# Patient Record
Sex: Male | Born: 1955 | Race: White | Hispanic: No | Marital: Single | Smoking: Never smoker
Health system: Southern US, Community
[De-identification: ages and names within clinical notes are randomized; demographics above are authoritative.]

## PROBLEM LIST (undated history)

## (undated) DIAGNOSIS — L409 Psoriasis, unspecified: Secondary | ICD-10-CM

## (undated) DIAGNOSIS — K219 Gastro-esophageal reflux disease without esophagitis: Secondary | ICD-10-CM

## (undated) DIAGNOSIS — G47419 Narcolepsy without cataplexy: Secondary | ICD-10-CM

## (undated) DIAGNOSIS — G43909 Migraine, unspecified, not intractable, without status migrainosus: Secondary | ICD-10-CM

## (undated) DIAGNOSIS — L4050 Arthropathic psoriasis, unspecified: Secondary | ICD-10-CM

## (undated) DIAGNOSIS — G4730 Sleep apnea, unspecified: Secondary | ICD-10-CM

## (undated) DIAGNOSIS — I10 Essential (primary) hypertension: Secondary | ICD-10-CM

## (undated) HISTORY — DX: Essential (primary) hypertension: I10

## (undated) HISTORY — DX: Migraine, unspecified, not intractable, without status migrainosus: G43.909

## (undated) HISTORY — DX: Narcolepsy without cataplexy: G47.419

## (undated) HISTORY — PX: COLONOSCOPY: SHX174

## (undated) HISTORY — DX: Sleep apnea, unspecified: G47.30

## (undated) HISTORY — DX: Psoriasis, unspecified: L40.9

## (undated) HISTORY — DX: Arthropathic psoriasis, unspecified: L40.50

## (undated) HISTORY — DX: Gastro-esophageal reflux disease without esophagitis: K21.9

---

## 2003-07-13 ENCOUNTER — Ambulatory Visit (HOSPITAL_COMMUNITY): Admission: RE | Admit: 2003-07-13 | Discharge: 2003-07-13 | Payer: Self-pay | Admitting: Gastroenterology

## 2013-07-24 ENCOUNTER — Other Ambulatory Visit (HOSPITAL_BASED_OUTPATIENT_CLINIC_OR_DEPARTMENT_OTHER): Payer: Self-pay | Admitting: Family Medicine

## 2013-07-24 DIAGNOSIS — M545 Low back pain, unspecified: Secondary | ICD-10-CM

## 2013-07-26 ENCOUNTER — Ambulatory Visit (HOSPITAL_BASED_OUTPATIENT_CLINIC_OR_DEPARTMENT_OTHER)
Admission: RE | Admit: 2013-07-26 | Discharge: 2013-07-26 | Disposition: A | Discharge status: Home / Self Care | Payer: 59 | Source: Ambulatory Visit | Attending: Family Medicine | Admitting: Family Medicine

## 2013-07-26 DIAGNOSIS — M5126 Other intervertebral disc displacement, lumbar region: Secondary | ICD-10-CM | POA: Insufficient documentation

## 2013-07-26 DIAGNOSIS — M545 Low back pain, unspecified: Secondary | ICD-10-CM

## 2013-09-04 HISTORY — PX: LUMBAR SPINE SURGERY: SHX701

## 2020-05-10 ENCOUNTER — Inpatient Hospital Stay (HOSPITAL_COMMUNITY)
Admission: EM | Admit: 2020-05-10 | Discharge: 2020-05-22 | DRG: 958 | Disposition: A | Discharge status: Rehabilitation Facility | Payer: 59 | Attending: Surgery | Admitting: Surgery

## 2020-05-10 ENCOUNTER — Emergency Department (HOSPITAL_COMMUNITY): Payer: 59

## 2020-05-10 DIAGNOSIS — E8889 Other specified metabolic disorders: Secondary | ICD-10-CM | POA: Diagnosis present

## 2020-05-10 DIAGNOSIS — I10 Essential (primary) hypertension: Secondary | ICD-10-CM | POA: Diagnosis present

## 2020-05-10 DIAGNOSIS — S2249XA Multiple fractures of ribs, unspecified side, initial encounter for closed fracture: Secondary | ICD-10-CM | POA: Diagnosis present

## 2020-05-10 DIAGNOSIS — S2241XA Multiple fractures of ribs, right side, initial encounter for closed fracture: Principal | ICD-10-CM | POA: Diagnosis present

## 2020-05-10 DIAGNOSIS — Z419 Encounter for procedure for purposes other than remedying health state, unspecified: Secondary | ICD-10-CM

## 2020-05-10 DIAGNOSIS — E559 Vitamin D deficiency, unspecified: Secondary | ICD-10-CM | POA: Diagnosis present

## 2020-05-10 DIAGNOSIS — R0781 Pleurodynia: Secondary | ICD-10-CM

## 2020-05-10 DIAGNOSIS — L4050 Arthropathic psoriasis, unspecified: Secondary | ICD-10-CM | POA: Diagnosis present

## 2020-05-10 DIAGNOSIS — S2239XA Fracture of one rib, unspecified side, initial encounter for closed fracture: Secondary | ICD-10-CM | POA: Diagnosis not present

## 2020-05-10 DIAGNOSIS — Y9355 Activity, bike riding: Secondary | ICD-10-CM

## 2020-05-10 DIAGNOSIS — I951 Orthostatic hypotension: Secondary | ICD-10-CM | POA: Diagnosis not present

## 2020-05-10 DIAGNOSIS — Z20822 Contact with and (suspected) exposure to covid-19: Secondary | ICD-10-CM | POA: Diagnosis present

## 2020-05-10 DIAGNOSIS — S32599A Other specified fracture of unspecified pubis, initial encounter for closed fracture: Secondary | ICD-10-CM

## 2020-05-10 DIAGNOSIS — S42001A Fracture of unspecified part of right clavicle, initial encounter for closed fracture: Secondary | ICD-10-CM | POA: Diagnosis present

## 2020-05-10 DIAGNOSIS — S32431A Displaced fracture of anterior column [iliopubic] of right acetabulum, initial encounter for closed fracture: Secondary | ICD-10-CM | POA: Diagnosis present

## 2020-05-10 DIAGNOSIS — S270XXA Traumatic pneumothorax, initial encounter: Secondary | ICD-10-CM | POA: Diagnosis present

## 2020-05-10 DIAGNOSIS — Z23 Encounter for immunization: Secondary | ICD-10-CM

## 2020-05-10 DIAGNOSIS — S32592A Other specified fracture of left pubis, initial encounter for closed fracture: Secondary | ICD-10-CM | POA: Diagnosis present

## 2020-05-10 DIAGNOSIS — R339 Retention of urine, unspecified: Secondary | ICD-10-CM | POA: Diagnosis present

## 2020-05-10 DIAGNOSIS — T148XXA Other injury of unspecified body region, initial encounter: Secondary | ICD-10-CM

## 2020-05-10 DIAGNOSIS — S32451A Displaced transverse fracture of right acetabulum, initial encounter for closed fracture: Secondary | ICD-10-CM | POA: Diagnosis present

## 2020-05-10 DIAGNOSIS — S32301A Unspecified fracture of right ilium, initial encounter for closed fracture: Secondary | ICD-10-CM | POA: Diagnosis present

## 2020-05-10 DIAGNOSIS — D62 Acute posthemorrhagic anemia: Secondary | ICD-10-CM | POA: Diagnosis not present

## 2020-05-10 DIAGNOSIS — S42021A Displaced fracture of shaft of right clavicle, initial encounter for closed fracture: Secondary | ICD-10-CM | POA: Diagnosis present

## 2020-05-10 DIAGNOSIS — S32401A Unspecified fracture of right acetabulum, initial encounter for closed fracture: Secondary | ICD-10-CM

## 2020-05-10 LAB — SAMPLE TO BLOOD BANK

## 2020-05-10 LAB — COMPREHENSIVE METABOLIC PANEL
ALT: 31 U/L (ref 0–44)
AST: 36 U/L (ref 15–41)
Albumin: 3.6 g/dL (ref 3.5–5.0)
Alkaline Phosphatase: 78 U/L (ref 38–126)
Anion gap: 8 (ref 5–15)
BUN: 20 mg/dL (ref 8–23)
CO2: 22 mmol/L (ref 22–32)
Calcium: 8.3 mg/dL — ABNORMAL LOW (ref 8.9–10.3)
Chloride: 109 mmol/L (ref 98–111)
Creatinine, Ser: 0.98 mg/dL (ref 0.61–1.24)
GFR calc Af Amer: >60 mL/min (ref >60)
GFR calc non Af Amer: >60 mL/min (ref >60)
Glucose, Bld: 119 mg/dL — ABNORMAL HIGH (ref 70–99)
Potassium: 3.9 mmol/L (ref 3.5–5.1)
Sodium: 139 mmol/L (ref 135–145)
Total Bilirubin: 0.5 mg/dL (ref 0.3–1.2)
Total Protein: 5.5 g/dL — ABNORMAL LOW (ref 6.5–8.1)

## 2020-05-10 LAB — I-STAT CHEM 8, ED
BUN: 23 mg/dL (ref 8–23)
Calcium, Ion: 1.04 mmol/L — ABNORMAL LOW (ref 1.15–1.40)
Chloride: 107 mmol/L (ref 98–111)
Creatinine, Ser: 1 mg/dL (ref 0.61–1.24)
Glucose, Bld: 114 mg/dL — ABNORMAL HIGH (ref 70–99)
HCT: 39 % (ref 39.0–52.0)
Hemoglobin: 13.3 g/dL (ref 13.0–17.0)
Potassium: 3.8 mmol/L (ref 3.5–5.1)
Sodium: 141 mmol/L (ref 135–145)
TCO2: 22 mmol/L (ref 22–32)

## 2020-05-10 LAB — CBC
HCT: 41.4 % (ref 39.0–52.0)
Hemoglobin: 13.1 g/dL (ref 13.0–17.0)
MCH: 28.6 pg (ref 26.0–34.0)
MCHC: 31.6 g/dL (ref 30.0–36.0)
MCV: 90.4 fL (ref 80.0–100.0)
Platelets: 255 10*3/uL (ref 150–400)
RBC: 4.58 MIL/uL (ref 4.22–5.81)
RDW: 13.4 % (ref 11.5–15.5)
WBC: 17.1 10*3/uL — ABNORMAL HIGH (ref 4.0–10.5)
nRBC: 0 % (ref 0.0–0.2)

## 2020-05-10 LAB — ETHANOL: Alcohol, Ethyl (B): <10 mg/dL (ref <10)

## 2020-05-10 LAB — GLUCOSE, CAPILLARY: Glucose-Capillary: 134 mg/dL — ABNORMAL HIGH (ref 70–99)

## 2020-05-10 LAB — LACTIC ACID, PLASMA: Lactic Acid, Venous: 1.7 mmol/L (ref 0.5–1.9)

## 2020-05-10 LAB — SARS CORONAVIRUS 2 BY RT PCR (HOSPITAL ORDER, PERFORMED IN ~~LOC~~ HOSPITAL LAB): SARS Coronavirus 2: NEGATIVE

## 2020-05-10 LAB — PROTIME-INR
INR: 1.1 (ref 0.8–1.2)
Prothrombin Time: 13.9 seconds (ref 11.4–15.2)

## 2020-05-10 MED ORDER — PANTOPRAZOLE SODIUM 40 MG IV SOLR
40.0000 mg | Freq: Every day | INTRAVENOUS | Status: DC
  Administered 2020-05-10: 40 mg via INTRAVENOUS
  Filled 2020-05-10 (×3): qty 40

## 2020-05-10 MED ORDER — HYDRALAZINE HCL 20 MG/ML IJ SOLN: 10.0000 mg | INTRAMUSCULAR | Status: DC | PRN

## 2020-05-10 MED ORDER — ACETAMINOPHEN 325 MG PO TABS
650.0000 mg | ORAL_TABLET | Freq: Four times a day (QID) | ORAL | Status: DC
  Administered 2020-05-10 – 2020-05-12 (×6): 650 mg via ORAL
  Filled 2020-05-10 (×6): qty 2

## 2020-05-10 MED ORDER — PANTOPRAZOLE SODIUM 40 MG PO TBEC
40.0000 mg | DELAYED_RELEASE_TABLET | Freq: Every day | ORAL | Status: DC
  Administered 2020-05-11 – 2020-05-22 (×11): 40 mg via ORAL
  Filled 2020-05-10 (×11): qty 1

## 2020-05-10 MED ORDER — TETANUS-DIPHTH-ACELL PERTUSSIS 5-2.5-18.5 LF-MCG/0.5 IM SUSP
0.5000 mL | Freq: Once | INTRAMUSCULAR | Status: AC
  Administered 2020-05-10: 0.5 mL via INTRAMUSCULAR
  Filled 2020-05-10: qty 0.5

## 2020-05-10 MED ORDER — IBUPROFEN 400 MG PO TABS
800.0000 mg | ORAL_TABLET | Freq: Three times a day (TID) | ORAL | Status: DC
  Administered 2020-05-10 – 2020-05-13 (×8): 800 mg via ORAL
  Filled 2020-05-10 (×9): qty 2

## 2020-05-10 MED ORDER — OXYCODONE HCL 5 MG PO TABS
5.0000 mg | ORAL_TABLET | ORAL | Status: DC | PRN
  Administered 2020-05-10 – 2020-05-11 (×2): 5 mg via ORAL
  Filled 2020-05-10 (×2): qty 1

## 2020-05-10 MED ORDER — HYDROMORPHONE HCL 1 MG/ML IJ SOLN
1.0000 mg | Freq: Once | INTRAMUSCULAR | Status: AC
  Administered 2020-05-10: 1 mg via INTRAVENOUS
  Filled 2020-05-10: qty 1

## 2020-05-10 MED ORDER — IOHEXOL 300 MG/ML  SOLN
100.0000 mL | Freq: Once | INTRAMUSCULAR | Status: AC | PRN
  Administered 2020-05-10: 100 mL via INTRAVENOUS

## 2020-05-10 MED ORDER — ONDANSETRON HCL 4 MG/2ML IJ SOLN
4.0000 mg | Freq: Once | INTRAMUSCULAR | Status: AC
  Administered 2020-05-10: 4 mg via INTRAVENOUS
  Filled 2020-05-10: qty 2

## 2020-05-10 MED ORDER — SODIUM CHLORIDE 0.9 % IV SOLN: INTRAVENOUS | Status: DC

## 2020-05-10 MED ORDER — ONDANSETRON 4 MG PO TBDP: 4.0000 mg | ORAL_TABLET | Freq: Four times a day (QID) | ORAL | Status: DC | PRN

## 2020-05-10 MED ORDER — ONDANSETRON HCL 4 MG/2ML IJ SOLN: 4.0000 mg | Freq: Four times a day (QID) | INTRAMUSCULAR | Status: DC | PRN

## 2020-05-10 MED ORDER — GABAPENTIN 300 MG PO CAPS
300.0000 mg | ORAL_CAPSULE | Freq: Three times a day (TID) | ORAL | Status: DC
  Administered 2020-05-10 – 2020-05-22 (×35): 300 mg via ORAL
  Filled 2020-05-10 (×35): qty 1

## 2020-05-10 MED ORDER — METHOCARBAMOL 500 MG PO TABS: 500.0000 mg | ORAL_TABLET | Freq: Four times a day (QID) | ORAL | Status: DC | PRN

## 2020-05-10 MED ORDER — METOPROLOL TARTRATE 5 MG/5ML IV SOLN
5.0000 mg | Freq: Four times a day (QID) | INTRAVENOUS | Status: DC | PRN
  Filled 2020-05-10: qty 5

## 2020-05-10 MED ORDER — DOCUSATE SODIUM 100 MG PO CAPS
100.0000 mg | ORAL_CAPSULE | Freq: Two times a day (BID) | ORAL | Status: DC
  Administered 2020-05-10 – 2020-05-22 (×21): 100 mg via ORAL
  Filled 2020-05-10 (×23): qty 1

## 2020-05-10 MED ORDER — HYDROMORPHONE HCL 1 MG/ML IJ SOLN
0.5000 mg | INTRAMUSCULAR | Status: DC | PRN
  Administered 2020-05-10 – 2020-05-14 (×7): 0.5 mg via INTRAVENOUS
  Filled 2020-05-10 (×7): qty 1
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
## 2020-05-10 NOTE — ED Notes (Signed)
Pt transported to xray 

## 2020-05-10 NOTE — ED Provider Notes (Signed)
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 this time. Consulted ortho and discussed case with Dr. Charlann Boxer who will review imaging and follow along in consultation. He will include consult note with recommendations. Patient reassessed multiple times, pain mildly improved with dilaudid. Stable at time of disposition.  Shared visit with ED attending Dr. Criss Alvine.   Portions of this note were generated with Scientist, clinical (histocompatibility and immunogenetics). Dictation errors may occur despite best attempts at proofreading.  Final Clinical Impression(s) / ED Diagnoses Final diagnoses:  Rib pain on right side  Closed fracture of multiple ribs of right side, initial encounter  Closed displaced fracture of right clavicle, unspecified part of clavicle, initial encounter  Closed fracture of pubic ramus, unspecified laterality, initial encounter [facility])  Traumatic pneumothorax, initial encounter    Rx / DC Orders ED Discharge Orders    None       Sherene Sires, PA-C 05/10/20 1836    Pricilla Loveless, MD 05/10/20 2000
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
## 2020-05-10 NOTE — ED Notes (Signed)
Pt given a urinal and notified of need for a urine sample. 

## 2020-05-11 ENCOUNTER — Other Ambulatory Visit: Payer: Self-pay

## 2020-05-11 ENCOUNTER — Observation Stay (HOSPITAL_COMMUNITY): Payer: 59

## 2020-05-11 DIAGNOSIS — Z741 Need for assistance with personal care: Secondary | ICD-10-CM | POA: Diagnosis not present

## 2020-05-11 DIAGNOSIS — S32592A Other specified fracture of left pubis, initial encounter for closed fracture: Secondary | ICD-10-CM | POA: Diagnosis present

## 2020-05-11 DIAGNOSIS — E8889 Other specified metabolic disorders: Secondary | ICD-10-CM | POA: Diagnosis present

## 2020-05-11 DIAGNOSIS — G8918 Other acute postprocedural pain: Secondary | ICD-10-CM | POA: Diagnosis not present

## 2020-05-11 DIAGNOSIS — I10 Essential (primary) hypertension: Secondary | ICD-10-CM | POA: Diagnosis present

## 2020-05-11 DIAGNOSIS — S32301A Unspecified fracture of right ilium, initial encounter for closed fracture: Secondary | ICD-10-CM | POA: Diagnosis present

## 2020-05-11 DIAGNOSIS — S32401A Unspecified fracture of right acetabulum, initial encounter for closed fracture: Secondary | ICD-10-CM | POA: Diagnosis not present

## 2020-05-11 DIAGNOSIS — S42021A Displaced fracture of shaft of right clavicle, initial encounter for closed fracture: Secondary | ICD-10-CM | POA: Diagnosis present

## 2020-05-11 DIAGNOSIS — S2239XA Fracture of one rib, unspecified side, initial encounter for closed fracture: Secondary | ICD-10-CM | POA: Diagnosis present

## 2020-05-11 DIAGNOSIS — I951 Orthostatic hypotension: Secondary | ICD-10-CM | POA: Diagnosis not present

## 2020-05-11 DIAGNOSIS — S270XXA Traumatic pneumothorax, initial encounter: Secondary | ICD-10-CM | POA: Diagnosis present

## 2020-05-11 DIAGNOSIS — S32431A Displaced fracture of anterior column [iliopubic] of right acetabulum, initial encounter for closed fracture: Secondary | ICD-10-CM | POA: Diagnosis present

## 2020-05-11 DIAGNOSIS — S32451A Displaced transverse fracture of right acetabulum, initial encounter for closed fracture: Secondary | ICD-10-CM | POA: Diagnosis present

## 2020-05-11 DIAGNOSIS — K5903 Drug induced constipation: Secondary | ICD-10-CM | POA: Diagnosis not present

## 2020-05-11 DIAGNOSIS — R339 Retention of urine, unspecified: Secondary | ICD-10-CM | POA: Diagnosis present

## 2020-05-11 DIAGNOSIS — Y9355 Activity, bike riding: Secondary | ICD-10-CM | POA: Diagnosis not present

## 2020-05-11 DIAGNOSIS — Z23 Encounter for immunization: Secondary | ICD-10-CM | POA: Diagnosis not present

## 2020-05-11 DIAGNOSIS — S32491D Other specified fracture of right acetabulum, subsequent encounter for fracture with routine healing: Secondary | ICD-10-CM | POA: Diagnosis not present

## 2020-05-11 DIAGNOSIS — D62 Acute posthemorrhagic anemia: Secondary | ICD-10-CM | POA: Diagnosis not present

## 2020-05-11 DIAGNOSIS — L4050 Arthropathic psoriasis, unspecified: Secondary | ICD-10-CM | POA: Diagnosis present

## 2020-05-11 DIAGNOSIS — S32491S Other specified fracture of right acetabulum, sequela: Secondary | ICD-10-CM | POA: Diagnosis not present

## 2020-05-11 DIAGNOSIS — E871 Hypo-osmolality and hyponatremia: Secondary | ICD-10-CM | POA: Diagnosis not present

## 2020-05-11 DIAGNOSIS — S2241XA Multiple fractures of ribs, right side, initial encounter for closed fracture: Secondary | ICD-10-CM | POA: Diagnosis present

## 2020-05-11 DIAGNOSIS — Z7409 Other reduced mobility: Secondary | ICD-10-CM | POA: Diagnosis not present

## 2020-05-11 DIAGNOSIS — S2249XD Multiple fractures of ribs, unspecified side, subsequent encounter for fracture with routine healing: Secondary | ICD-10-CM | POA: Diagnosis not present

## 2020-05-11 DIAGNOSIS — S42001D Fracture of unspecified part of right clavicle, subsequent encounter for fracture with routine healing: Secondary | ICD-10-CM | POA: Diagnosis not present

## 2020-05-11 DIAGNOSIS — S32401D Unspecified fracture of right acetabulum, subsequent encounter for fracture with routine healing: Secondary | ICD-10-CM | POA: Diagnosis not present

## 2020-05-11 DIAGNOSIS — E559 Vitamin D deficiency, unspecified: Secondary | ICD-10-CM | POA: Diagnosis present

## 2020-05-11 DIAGNOSIS — Z20822 Contact with and (suspected) exposure to covid-19: Secondary | ICD-10-CM | POA: Diagnosis present

## 2020-05-11 LAB — MAGNESIUM: Magnesium: 1.8 mg/dL (ref 1.7–2.4)

## 2020-05-11 LAB — URINALYSIS, ROUTINE W REFLEX MICROSCOPIC
Bilirubin Urine: NEGATIVE
Glucose, UA: NEGATIVE mg/dL
Hgb urine dipstick: NEGATIVE
Ketones, ur: 5 mg/dL — AB
Leukocytes,Ua: NEGATIVE
Nitrite: NEGATIVE
Protein, ur: NEGATIVE mg/dL
Specific Gravity, Urine: >1.046 — ABNORMAL HIGH (ref 1.005–1.030)
pH: 5 (ref 5.0–8.0)

## 2020-05-11 LAB — CBC
HCT: 37.2 % — ABNORMAL LOW (ref 39.0–52.0)
Hemoglobin: 11.7 g/dL — ABNORMAL LOW (ref 13.0–17.0)
MCH: 28.3 pg (ref 26.0–34.0)
MCHC: 31.5 g/dL (ref 30.0–36.0)
MCV: 89.9 fL (ref 80.0–100.0)
Platelets: 196 10*3/uL (ref 150–400)
RBC: 4.14 MIL/uL — ABNORMAL LOW (ref 4.22–5.81)
RDW: 13.8 % (ref 11.5–15.5)
WBC: 10.8 10*3/uL — ABNORMAL HIGH (ref 4.0–10.5)
nRBC: 0 % (ref 0.0–0.2)

## 2020-05-11 LAB — BASIC METABOLIC PANEL
Anion gap: 9 (ref 5–15)
BUN: 20 mg/dL (ref 8–23)
CO2: 23 mmol/L (ref 22–32)
Calcium: 8 mg/dL — ABNORMAL LOW (ref 8.9–10.3)
Chloride: 105 mmol/L (ref 98–111)
Creatinine, Ser: 0.93 mg/dL (ref 0.61–1.24)
GFR calc Af Amer: >60 mL/min (ref >60)
GFR calc non Af Amer: >60 mL/min (ref >60)
Glucose, Bld: 152 mg/dL — ABNORMAL HIGH (ref 70–99)
Potassium: 3.6 mmol/L (ref 3.5–5.1)
Sodium: 137 mmol/L (ref 135–145)

## 2020-05-11 LAB — HIV ANTIBODY (ROUTINE TESTING W REFLEX): HIV Screen 4th Generation wRfx: NONREACTIVE

## 2020-05-11 MED ORDER — CHLORHEXIDINE GLUCONATE CLOTH 2 % EX PADS
6.0000 | MEDICATED_PAD | Freq: Every day | CUTANEOUS | Status: DC
  Administered 2020-05-12 – 2020-05-17 (×5): 6 via TOPICAL

## 2020-05-11 MED ORDER — LISINOPRIL 5 MG PO TABS
5.0000 mg | ORAL_TABLET | Freq: Every day | ORAL | Status: DC
  Administered 2020-05-11 – 2020-05-19 (×8): 5 mg via ORAL
  Filled 2020-05-11 (×8): qty 1

## 2020-05-11 MED ORDER — ENOXAPARIN SODIUM 40 MG/0.4ML ~~LOC~~ SOLN
40.0000 mg | SUBCUTANEOUS | Status: DC
  Administered 2020-05-11: 40 mg via SUBCUTANEOUS
  Filled 2020-05-11: qty 0.4

## 2020-05-11 MED ORDER — LISINOPRIL-HYDROCHLOROTHIAZIDE 10-12.5 MG PO TABS: 0.5000 | ORAL_TABLET | Freq: Every day | ORAL | Status: DC

## 2020-05-11 MED ORDER — HYDROCHLOROTHIAZIDE 10 MG/ML ORAL SUSPENSION
6.2500 mg | Freq: Every day | ORAL | Status: DC
  Administered 2020-05-11 – 2020-05-16 (×4): 6.25 mg via ORAL
  Filled 2020-05-11 (×11): qty 1.25

## 2020-05-11 MED ORDER — OXYCODONE HCL 5 MG PO TABS
5.0000 mg | ORAL_TABLET | ORAL | Status: DC | PRN
  Administered 2020-05-12 – 2020-05-14 (×5): 10 mg via ORAL
  Administered 2020-05-14: 5 mg via ORAL
  Administered 2020-05-14 – 2020-05-17 (×8): 10 mg via ORAL
  Filled 2020-05-11 (×5): qty 2
  Filled 2020-05-11: qty 1
  Filled 2020-05-11 (×8): qty 2
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
## 2020-05-11 NOTE — Progress Notes (Signed)
Orthopedic Tech Progress Note Patient Details:  Arthur Rios 1955/11/24 [id number]  Ortho Devices Type of Ortho Device: Shoulder immobilizer Ortho Device/Splint Location: RUE Ortho Device/Splint Interventions: Ordered, Application   Post Interventions Patient Tolerated: Well Instructions Provided: Care of device   Donald Pore 05/11/2020, 11:59 AM

## 2020-05-11 NOTE — Progress Notes (Signed)
Patient with out void since In and out cath this am at 5am Patient bladder scan 169. Arthur Rios, [employer]

## 2020-05-11 NOTE — Progress Notes (Signed)
OT Cancellation Note  Patient Details Name: AMERY MINASYAN MRN: [id number] DOB: 12-27-1955   Cancelled Treatment:    Reason Eval/Treat Not Completed: Patient not medically ready (No WBing orders for LE. Will await ortho on WB status for LE)  Arissa Fagin M Keona Bilyeu Rain Friedt MSOT, OTR/L Acute Rehab Pager: [phone] Office: [phone] 05/11/2020, 11:34 AM

## 2020-05-11 NOTE — Progress Notes (Signed)
Spoke with PA on call regarding patient bladder scan and 5am bladder scan and early AM and In and out result. Orders received for foley cath. Will monitor patient. Caleel Kiner, Randall An RN
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
## 2020-05-12 ENCOUNTER — Inpatient Hospital Stay (HOSPITAL_COMMUNITY): Payer: 59

## 2020-05-12 LAB — CBC
HCT: 34.3 % — ABNORMAL LOW (ref 39.0–52.0)
Hemoglobin: 11.3 g/dL — ABNORMAL LOW (ref 13.0–17.0)
MCH: 29.4 pg (ref 26.0–34.0)
MCHC: 32.9 g/dL (ref 30.0–36.0)
MCV: 89.1 fL (ref 80.0–100.0)
Platelets: 164 10*3/uL (ref 150–400)
RBC: 3.85 MIL/uL — ABNORMAL LOW (ref 4.22–5.81)
RDW: 13.8 % (ref 11.5–15.5)
WBC: 9.2 10*3/uL (ref 4.0–10.5)
nRBC: 0 % (ref 0.0–0.2)

## 2020-05-12 LAB — BASIC METABOLIC PANEL
Anion gap: 6 (ref 5–15)
BUN: 16 mg/dL (ref 8–23)
CO2: 24 mmol/L (ref 22–32)
Calcium: 8 mg/dL — ABNORMAL LOW (ref 8.9–10.3)
Chloride: 106 mmol/L (ref 98–111)
Creatinine, Ser: 0.79 mg/dL (ref 0.61–1.24)
GFR calc Af Amer: >60 mL/min (ref >60)
GFR calc non Af Amer: >60 mL/min (ref >60)
Glucose, Bld: 97 mg/dL (ref 70–99)
Potassium: 3.6 mmol/L (ref 3.5–5.1)
Sodium: 136 mmol/L (ref 135–145)

## 2020-05-12 MED ORDER — CEFAZOLIN SODIUM-DEXTROSE 2-4 GM/100ML-% IV SOLN
2.0000 g | Freq: Once | INTRAVENOUS | Status: AC
  Administered 2020-05-13 (×2): 2 g via INTRAVENOUS
  Filled 2020-05-12: qty 100

## 2020-05-12 MED ORDER — MODAFINIL 100 MG PO TABS
200.0000 mg | ORAL_TABLET | Freq: Every day | ORAL | Status: DC
  Administered 2020-05-17: 200 mg via ORAL
  Filled 2020-05-12 (×4): qty 2

## 2020-05-12 MED ORDER — POLYETHYLENE GLYCOL 3350 17 G PO PACK
17.0000 g | PACK | Freq: Every day | ORAL | Status: DC
  Administered 2020-05-12: 17 g via ORAL
  Filled 2020-05-12: qty 1

## 2020-05-12 MED ORDER — ACETAMINOPHEN 500 MG PO TABS
1000.0000 mg | ORAL_TABLET | Freq: Four times a day (QID) | ORAL | Status: DC
  Administered 2020-05-12 – 2020-05-22 (×37): 1000 mg via ORAL
  Filled 2020-05-12 (×38): qty 2

## 2020-05-12 MED ORDER — MAGNESIUM SULFATE 2 GM/50ML IV SOLN
2.0000 g | Freq: Once | INTRAVENOUS | Status: AC
  Administered 2020-05-12: 2 g via INTRAVENOUS
  Filled 2020-05-12: qty 50

## 2020-05-12 MED ORDER — INDOMETHACIN 25 MG PO CAPS
50.0000 mg | ORAL_CAPSULE | Freq: Every evening | ORAL | Status: DC
  Filled 2020-05-12 (×3): qty 2

## 2020-05-12 MED ORDER — METHOCARBAMOL 500 MG PO TABS
1000.0000 mg | ORAL_TABLET | Freq: Three times a day (TID) | ORAL | Status: DC
  Administered 2020-05-12 – 2020-05-22 (×32): 1000 mg via ORAL
  Filled 2020-05-12 (×31): qty 2

## 2020-05-12 MED ORDER — ENOXAPARIN SODIUM 30 MG/0.3ML ~~LOC~~ SOLN
30.0000 mg | Freq: Two times a day (BID) | SUBCUTANEOUS | Status: DC
  Administered 2020-05-12 – 2020-05-15 (×6): 30 mg via SUBCUTANEOUS
  Filled 2020-05-12 (×6): qty 0.3

## 2020-05-12 MED ORDER — BETHANECHOL CHLORIDE 10 MG PO TABS
10.0000 mg | ORAL_TABLET | Freq: Three times a day (TID) | ORAL | Status: DC
  Administered 2020-05-12 – 2020-05-13 (×5): 10 mg via ORAL
  Filled 2020-05-12 (×7): qty 1

## 2020-05-12 NOTE — Progress Notes (Signed)
Rehab Admissions Coordinator Note:  Patient was screened by Clois Dupes for appropriateness for an Inpatient Acute Rehab Consult per therapy recs. Noted for OR tomorrow and pain limitations with mobility with therapy today. I will follow his progress.  Clois Dupes RN MSN 05/12/2020, 1:01 PM  I can be reached at [phone].
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
## 2020-05-12 NOTE — TOC CAGE-AID Note (Signed)
Transition of Care [facility]) - CAGE-AID Screening   Patient Details  Name: Arthur Rios MRN: [id number] Date of Birth: December 06, 1955  Transition of Care [place]) CM/SW Contact:    Emeterio Reeve, [place] Phone Number: 05/12/2020, 1:47 PM   Clinical Narrative:  CSW met with pt at bedside. CSW introduced self and explained her role at the hospital.  Pt reports social alcohol use of 1-2x a month or less. Pt denies substance use. Pt declined resources.   CAGE-AID Screening:    Have You Ever Felt You Ought to Cut Down on Your Drinking or Drug Use?: No Have People Annoyed You By Critizing Your Drinking Or Drug Use?: No Have You Felt Bad Or Guilty About Your Drinking Or Drug Use?: No Have You Ever Had a Drink or Used Drugs First Thing In The Morning to Steady Your Nerves or to Get Rid of a Hangover?: No CAGE-AID Score: 0  Substance Abuse Education Offered: Yes    Blima Ledger, [place] Social Worker [phone]
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
## 2020-05-13 ENCOUNTER — Inpatient Hospital Stay (HOSPITAL_COMMUNITY): Payer: 59

## 2020-05-13 ENCOUNTER — Inpatient Hospital Stay (HOSPITAL_COMMUNITY): Payer: 59 | Admitting: Anesthesiology

## 2020-05-13 ENCOUNTER — Encounter (HOSPITAL_COMMUNITY): Admission: EM | Disposition: A | Discharge status: Rehabilitation Facility | Payer: Self-pay | Source: Home / Self Care

## 2020-05-13 HISTORY — PX: ORIF ACETABULAR FRACTURE: SHX5029

## 2020-05-13 HISTORY — PX: ORIF CLAVICULAR FRACTURE: SHX5055

## 2020-05-13 LAB — COMPREHENSIVE METABOLIC PANEL
ALT: 21 U/L (ref 0–44)
AST: 20 U/L (ref 15–41)
Albumin: 2.5 g/dL — ABNORMAL LOW (ref 3.5–5.0)
Alkaline Phosphatase: 54 U/L (ref 38–126)
Anion gap: 5 (ref 5–15)
BUN: 12 mg/dL (ref 8–23)
CO2: 25 mmol/L (ref 22–32)
Calcium: 7.6 mg/dL — ABNORMAL LOW (ref 8.9–10.3)
Chloride: 106 mmol/L (ref 98–111)
Creatinine, Ser: 0.83 mg/dL (ref 0.61–1.24)
GFR calc Af Amer: >60 mL/min (ref >60)
GFR calc non Af Amer: >60 mL/min (ref >60)
Glucose, Bld: 102 mg/dL — ABNORMAL HIGH (ref 70–99)
Potassium: 3.5 mmol/L (ref 3.5–5.1)
Sodium: 136 mmol/L (ref 135–145)
Total Bilirubin: 0.7 mg/dL (ref 0.3–1.2)
Total Protein: 4.4 g/dL — ABNORMAL LOW (ref 6.5–8.1)

## 2020-05-13 LAB — CBC
HCT: 31.7 % — ABNORMAL LOW (ref 39.0–52.0)
Hemoglobin: 10.3 g/dL — ABNORMAL LOW (ref 13.0–17.0)
MCH: 28.8 pg (ref 26.0–34.0)
MCHC: 32.5 g/dL (ref 30.0–36.0)
MCV: 88.5 fL (ref 80.0–100.0)
Platelets: 165 10*3/uL (ref 150–400)
RBC: 3.58 MIL/uL — ABNORMAL LOW (ref 4.22–5.81)
RDW: 13.5 % (ref 11.5–15.5)
WBC: 7.3 10*3/uL (ref 4.0–10.5)
nRBC: 0 % (ref 0.0–0.2)

## 2020-05-13 LAB — PROTIME-INR
INR: 1.1 (ref 0.8–1.2)
Prothrombin Time: 13.7 seconds (ref 11.4–15.2)

## 2020-05-13 LAB — SURGICAL PCR SCREEN
MRSA, PCR: POSITIVE — AB
Staphylococcus aureus: POSITIVE — AB

## 2020-05-13 LAB — VITAMIN D 25 HYDROXY (VIT D DEFICIENCY, FRACTURES): Vit D, 25-Hydroxy: 26.92 ng/mL — ABNORMAL LOW (ref 30–100)

## 2020-05-13 SURGERY — OPEN REDUCTION INTERNAL FIXATION (ORIF) ACETABULAR FRACTURE
Anesthesia: General | Site: Shoulder | Laterality: Right

## 2020-05-13 MED ORDER — DEXAMETHASONE SODIUM PHOSPHATE 10 MG/ML IJ SOLN
INTRAMUSCULAR | Status: DC | PRN
  Administered 2020-05-13: 10 mg via INTRAVENOUS

## 2020-05-13 MED ORDER — LACTATED RINGERS IV SOLN: INTRAVENOUS | Status: DC

## 2020-05-13 MED ORDER — HYDROMORPHONE HCL 1 MG/ML IJ SOLN
INTRAMUSCULAR | Status: DC | PRN
  Administered 2020-05-13: .5 mg via INTRAVENOUS

## 2020-05-13 MED ORDER — CHLORHEXIDINE GLUCONATE 0.12 % MT SOLN
OROMUCOSAL | Status: AC
  Administered 2020-05-13: 15 mL via OROMUCOSAL
  Filled 2020-05-13: qty 15

## 2020-05-13 MED ORDER — SUGAMMADEX SODIUM 200 MG/2ML IV SOLN
INTRAVENOUS | Status: DC | PRN
  Administered 2020-05-13: 200 mg via INTRAVENOUS

## 2020-05-13 MED ORDER — PROPOFOL 10 MG/ML IV BOLUS
INTRAVENOUS | Status: DC | PRN
  Administered 2020-05-13: 180 mg via INTRAVENOUS

## 2020-05-13 MED ORDER — MEPERIDINE HCL 25 MG/ML IJ SOLN: 6.2500 mg | INTRAMUSCULAR | Status: DC | PRN

## 2020-05-13 MED ORDER — HYDROMORPHONE HCL 1 MG/ML IJ SOLN
0.2500 mg | INTRAMUSCULAR | Status: DC | PRN
  Administered 2020-05-13: 0.25 mg via INTRAVENOUS
  Administered 2020-05-13: 0.5 mg via INTRAVENOUS
  Administered 2020-05-13: 0.25 mg via INTRAVENOUS

## 2020-05-13 MED ORDER — PROPOFOL 10 MG/ML IV BOLUS
INTRAVENOUS | Status: AC
  Filled 2020-05-13: qty 20

## 2020-05-13 MED ORDER — ACETAMINOPHEN 325 MG PO TABS: 325.0000 mg | ORAL_TABLET | Freq: Once | ORAL | Status: DC | PRN

## 2020-05-13 MED ORDER — ROCURONIUM BROMIDE 100 MG/10ML IV SOLN
INTRAVENOUS | Status: DC | PRN
  Administered 2020-05-13 (×2): 30 mg via INTRAVENOUS
  Administered 2020-05-13: 60 mg via INTRAVENOUS
  Administered 2020-05-13: 40 mg via INTRAVENOUS

## 2020-05-13 MED ORDER — ACETAMINOPHEN 10 MG/ML IV SOLN
INTRAVENOUS | Status: AC
  Filled 2020-05-13: qty 100

## 2020-05-13 MED ORDER — CEFAZOLIN SODIUM-DEXTROSE 2-4 GM/100ML-% IV SOLN
2.0000 g | Freq: Three times a day (TID) | INTRAVENOUS | Status: AC
  Administered 2020-05-13 – 2020-05-14 (×3): 2 g via INTRAVENOUS
  Filled 2020-05-13 (×3): qty 100

## 2020-05-13 MED ORDER — ROCURONIUM BROMIDE 10 MG/ML (PF) SYRINGE
PREFILLED_SYRINGE | INTRAVENOUS | Status: AC
  Filled 2020-05-13: qty 30

## 2020-05-13 MED ORDER — ONDANSETRON HCL 4 MG/2ML IJ SOLN
INTRAMUSCULAR | Status: DC | PRN
  Administered 2020-05-13: 4 mg via INTRAVENOUS

## 2020-05-13 MED ORDER — ORAL CARE MOUTH RINSE: 15.0000 mL | Freq: Once | OROMUCOSAL | Status: AC

## 2020-05-13 MED ORDER — LIDOCAINE 2% (20 MG/ML) 5 ML SYRINGE
INTRAMUSCULAR | Status: AC
  Filled 2020-05-13: qty 10

## 2020-05-13 MED ORDER — VANCOMYCIN HCL IN DEXTROSE 1-5 GM/200ML-% IV SOLN
1000.0000 mg | Freq: Once | INTRAVENOUS | Status: AC
  Administered 2020-05-13: 1000 mg via INTRAVENOUS
  Filled 2020-05-13: qty 200

## 2020-05-13 MED ORDER — EPHEDRINE SULFATE-NACL 50-0.9 MG/10ML-% IV SOSY
PREFILLED_SYRINGE | INTRAVENOUS | Status: DC | PRN
  Administered 2020-05-13: 5 mg via INTRAVENOUS
  Administered 2020-05-13: 10 mg via INTRAVENOUS

## 2020-05-13 MED ORDER — ALBUMIN HUMAN 5 % IV SOLN: INTRAVENOUS | Status: DC | PRN

## 2020-05-13 MED ORDER — SODIUM CHLORIDE 0.9 % IV SOLN
INTRAVENOUS | Status: DC | PRN
  Administered 2020-05-13: 40 mL

## 2020-05-13 MED ORDER — AMISULPRIDE (ANTIEMETIC) 5 MG/2ML IV SOLN
10.0000 mg | Freq: Once | INTRAVENOUS | Status: AC | PRN
  Administered 2020-05-13: 10 mg via INTRAVENOUS

## 2020-05-13 MED ORDER — AMISULPRIDE (ANTIEMETIC) 5 MG/2ML IV SOLN
INTRAVENOUS | Status: AC
  Filled 2020-05-13: qty 2

## 2020-05-13 MED ORDER — CHLORHEXIDINE GLUCONATE 0.12 % MT SOLN: 15.0000 mL | Freq: Once | OROMUCOSAL | Status: AC

## 2020-05-13 MED ORDER — CEFAZOLIN SODIUM-DEXTROSE 2-4 GM/100ML-% IV SOLN
INTRAVENOUS | Status: AC
  Filled 2020-05-13: qty 100

## 2020-05-13 MED ORDER — VITAMIN D 25 MCG (1000 UNIT) PO TABS
2000.0000 [IU] | ORAL_TABLET | Freq: Two times a day (BID) | ORAL | Status: DC
  Administered 2020-05-13 – 2020-05-22 (×18): 2000 [IU] via ORAL
  Filled 2020-05-13 (×19): qty 2

## 2020-05-13 MED ORDER — FENTANYL CITRATE (PF) 250 MCG/5ML IJ SOLN
INTRAMUSCULAR | Status: DC | PRN
  Administered 2020-05-13: 75 ug via INTRAVENOUS
  Administered 2020-05-13: 100 ug via INTRAVENOUS
  Administered 2020-05-13 (×2): 50 ug via INTRAVENOUS
  Administered 2020-05-13: 125 ug via INTRAVENOUS
  Administered 2020-05-13: 50 ug via INTRAVENOUS

## 2020-05-13 MED ORDER — ACETAMINOPHEN 160 MG/5ML PO SOLN: 325.0000 mg | Freq: Once | ORAL | Status: DC | PRN

## 2020-05-13 MED ORDER — ASCORBIC ACID 500 MG PO TABS
1000.0000 mg | ORAL_TABLET | Freq: Every day | ORAL | Status: DC
  Administered 2020-05-13 – 2020-05-22 (×10): 1000 mg via ORAL
  Filled 2020-05-13 (×10): qty 2

## 2020-05-13 MED ORDER — 0.9 % SODIUM CHLORIDE (POUR BTL) OPTIME
TOPICAL | Status: DC | PRN
  Administered 2020-05-13: 1000 mL

## 2020-05-13 MED ORDER — HYDROMORPHONE HCL 1 MG/ML IJ SOLN
INTRAMUSCULAR | Status: AC
  Filled 2020-05-13: qty 0.5

## 2020-05-13 MED ORDER — ONDANSETRON HCL 4 MG/2ML IJ SOLN
INTRAMUSCULAR | Status: AC
  Filled 2020-05-13: qty 2

## 2020-05-13 MED ORDER — METHOCARBAMOL 500 MG PO TABS
ORAL_TABLET | ORAL | Status: AC
  Filled 2020-05-13: qty 2

## 2020-05-13 MED ORDER — VITAMIN D (ERGOCALCIFEROL) 1.25 MG (50000 UNIT) PO CAPS
50000.0000 [IU] | ORAL_CAPSULE | ORAL | Status: DC
  Administered 2020-05-13 – 2020-05-20 (×2): 50000 [IU] via ORAL
  Filled 2020-05-13 (×2): qty 1

## 2020-05-13 MED ORDER — EPHEDRINE 5 MG/ML INJ
INTRAVENOUS | Status: AC
  Filled 2020-05-13: qty 10

## 2020-05-13 MED ORDER — DEXAMETHASONE SODIUM PHOSPHATE 10 MG/ML IJ SOLN
INTRAMUSCULAR | Status: AC
  Filled 2020-05-13: qty 1

## 2020-05-13 MED ORDER — FENTANYL CITRATE (PF) 250 MCG/5ML IJ SOLN
INTRAMUSCULAR | Status: AC
  Filled 2020-05-13: qty 5

## 2020-05-13 MED ORDER — LIDOCAINE HCL (CARDIAC) PF 100 MG/5ML IV SOSY
PREFILLED_SYRINGE | INTRAVENOUS | Status: DC | PRN
  Administered 2020-05-13: 60 mg via INTRATRACHEAL

## 2020-05-13 MED ORDER — SODIUM CHLORIDE 0.9 % IR SOLN
Status: DC | PRN
  Administered 2020-05-13: 3000 mL

## 2020-05-13 MED ORDER — MIDAZOLAM HCL 2 MG/2ML IJ SOLN
INTRAMUSCULAR | Status: AC
  Filled 2020-05-13: qty 2

## 2020-05-13 MED ORDER — ADULT MULTIVITAMIN W/MINERALS CH
1.0000 | ORAL_TABLET | Freq: Every day | ORAL | Status: DC
  Administered 2020-05-13 – 2020-05-22 (×10): 1 via ORAL
  Filled 2020-05-13 (×9): qty 1

## 2020-05-13 MED ORDER — HYDROMORPHONE HCL 1 MG/ML IJ SOLN
INTRAMUSCULAR | Status: AC
  Filled 2020-05-13: qty 1

## 2020-05-13 MED ORDER — MIDAZOLAM HCL 5 MG/5ML IJ SOLN
INTRAMUSCULAR | Status: DC | PRN
  Administered 2020-05-13: 2 mg via INTRAVENOUS

## 2020-05-13 MED ORDER — ACETAMINOPHEN 10 MG/ML IV SOLN
1000.0000 mg | Freq: Once | INTRAVENOUS | Status: DC | PRN
  Administered 2020-05-13: 1000 mg via INTRAVENOUS

## 2020-05-13 SURGICAL SUPPLY — 107 items
ADPR CHK UNV DRL (ORTHOPEDIC DISPOSABLE SUPPLIES) ×2
APPLIER CLIP 11 MED OPEN (CLIP) ×3
APR CLP MED 11 20 MLT OPN (CLIP) ×2
BIT DRILL 2.8X5 QR DISP (BIT) ×3 IMPLANT
BIT DRILL AO MATTA 2.5MX230M (BIT) ×2 IMPLANT
BIT DRILL STEP 3.5 (DRILL) IMPLANT
BIT DRILL TWST MATTA 3.5MX195M (BIT) ×2 IMPLANT
BLADE CLIPPER SURG (BLADE) IMPLANT
BNDG COHESIVE 4X5 TAN STRL (GAUZE/BANDAGES/DRESSINGS) IMPLANT
BRUSH SCRUB EZ PLAIN DRY (MISCELLANEOUS) ×6 IMPLANT
CHUCK UNIV PIN HOFFMAN 3 (ORTHOPEDIC DISPOSABLE SUPPLIES) ×3 IMPLANT
CLEANER TIP ELECTROSURG 2X2 (MISCELLANEOUS) ×3 IMPLANT
CLIP APPLIE 11 MED OPEN (CLIP) ×2 IMPLANT
COVER SURGICAL LIGHT HANDLE (MISCELLANEOUS) ×9 IMPLANT
COVER WAND RF STERILE (DRAPES) ×3 IMPLANT
DRAPE C-ARM 42X72 X-RAY (DRAPES) ×3 IMPLANT
DRAPE C-ARMOR (DRAPES) ×3 IMPLANT
DRAPE INCISE IOBAN 66X45 STRL (DRAPES) ×3 IMPLANT
DRAPE INCISE IOBAN 85X60 (DRAPES) ×3 IMPLANT
DRAPE LAPAROTOMY TRNSV 102X78 (DRAPES) ×3 IMPLANT
DRAPE ORTHO SPLIT 77X108 STRL (DRAPES) ×6
DRAPE SURG ORHT 6 SPLT 77X108 (DRAPES) ×4 IMPLANT
DRAPE U-SHAPE 47X51 STRL (DRAPES) ×6 IMPLANT
DRILL BIT AO MATTA 2.5MX230M (BIT) ×3
DRILL STEP 3.5 (DRILL)
DRILL TWIST AO MATTA 3.5MX195M (BIT) ×3
DRSG MEPILEX BORDER 4X12 (GAUZE/BANDAGES/DRESSINGS) IMPLANT
DRSG MEPILEX BORDER 4X4 (GAUZE/BANDAGES/DRESSINGS) ×3 IMPLANT
DRSG MEPILEX BORDER 4X8 (GAUZE/BANDAGES/DRESSINGS) ×6 IMPLANT
ELECT BLADE 4.0 EZ CLEAN MEGAD (MISCELLANEOUS) ×3
ELECT BLADE 6.5 EXT (BLADE) ×3 IMPLANT
ELECT REM PT RETURN 9FT ADLT (ELECTROSURGICAL) ×3
ELECTRODE BLDE 4.0 EZ CLN MEGD (MISCELLANEOUS) ×2 IMPLANT
ELECTRODE REM PT RTRN 9FT ADLT (ELECTROSURGICAL) ×2 IMPLANT
GLOVE BIO SURGEON STRL SZ7.5 (GLOVE) ×6 IMPLANT
GLOVE BIO SURGEON STRL SZ8 (GLOVE) ×6 IMPLANT
GLOVE BIOGEL PI IND STRL 7.5 (GLOVE) ×4 IMPLANT
GLOVE BIOGEL PI IND STRL 8 (GLOVE) ×4 IMPLANT
GLOVE BIOGEL PI INDICATOR 7.5 (GLOVE) ×2
GLOVE BIOGEL PI INDICATOR 8 (GLOVE) ×2
GLOVE ECLIPSE 6.5 STRL STRAW (GLOVE) ×3 IMPLANT
GOWN STRL REUS W/ TWL LRG LVL3 (GOWN DISPOSABLE) ×4 IMPLANT
GOWN STRL REUS W/ TWL XL LVL3 (GOWN DISPOSABLE) ×4 IMPLANT
GOWN STRL REUS W/TWL LRG LVL3 (GOWN DISPOSABLE) ×6
GOWN STRL REUS W/TWL XL LVL3 (GOWN DISPOSABLE) ×6
HANDPIECE INTERPULSE COAX TIP (DISPOSABLE) ×3
KIT BASIN OR (CUSTOM PROCEDURE TRAY) ×3 IMPLANT
KIT TURNOVER KIT B (KITS) ×3 IMPLANT
MANIFOLD NEPTUNE II (INSTRUMENTS) ×3 IMPLANT
NEEDLE 22X1 1/2 (OR ONLY) (NEEDLE) ×3 IMPLANT
NEEDLE HYPO 25GX1X1/2 BEV (NEEDLE) IMPLANT
NS IRRIG 1000ML POUR BTL (IV SOLUTION) ×3 IMPLANT
PACK GENERAL/GYN (CUSTOM PROCEDURE TRAY) ×3 IMPLANT
PACK TOTAL JOINT (CUSTOM PROCEDURE TRAY) ×3 IMPLANT
PAD ARMBOARD 7.5X6 YLW CONV (MISCELLANEOUS) ×6 IMPLANT
PENCIL BUTTON HOLSTER BLD 10FT (ELECTRODE) ×3 IMPLANT
PIN APEX 5.0X200MM (PIN) ×3 IMPLANT
PLATE LOCKING CLAVICLE 10 HOLE (Plate) ×3 IMPLANT
PLATE SUPRAPECTINEAL PELVIS (Plate) ×3 IMPLANT
RETRACTOR ONETRAX LX 135X30 (MISCELLANEOUS) ×3 IMPLANT
RETRIEVER SUT HEWSON (MISCELLANEOUS) ×3 IMPLANT
SCREW CORTEX ST MATTA 3.5X24 (Screw) ×3 IMPLANT
SCREW CORTEX ST MATTA 3.5X26MM (Screw) ×3 IMPLANT
SCREW CORTEX ST MATTA 3.5X28MM (Screw) ×3 IMPLANT
SCREW CORTEX ST MATTA 3.5X30MM (Screw) ×9 IMPLANT
SCREW CORTEX ST MATTA 3.5X36MM (Screw) ×9 IMPLANT
SCREW CORTEX ST MATTA 3.5X38M (Screw) ×3 IMPLANT
SCREW CORTEX ST MATTA 3.5X50MM (Screw) ×3 IMPLANT
SCREW CORTEX ST MATTA 3.5X60MM (Screw) ×3 IMPLANT
SCREW CORTEX ST MATTA 3.5X65MM (Screw) ×3 IMPLANT
SCREW HEXALOBE LOCKING 3.5X14M (Screw) ×3 IMPLANT
SCREW HEXALOBE LOCKING 3.5X16M (Screw) ×6 IMPLANT
SCREW HEXALOBE NON-LOCK 3.5X16 (Screw) ×6 IMPLANT
SCREW LOCK 12X3.5X HEXALOBE (Screw) ×4 IMPLANT
SCREW LOCKING 3.5X12 (Screw) ×6 IMPLANT
SET HNDPC FAN SPRY TIP SCT (DISPOSABLE) ×2 IMPLANT
SLING ARM IMMOBILIZER LRG (SOFTGOODS) IMPLANT
SLING ARM IMMOBILIZER MED (SOFTGOODS) IMPLANT
SPONGE LAP 18X18 RF (DISPOSABLE) IMPLANT
STAPLER VISISTAT 35W (STAPLE) ×3 IMPLANT
STOCKINETTE IMPERVIOUS 9X36 MD (GAUZE/BANDAGES/DRESSINGS) IMPLANT
STRIP CLOSURE SKIN 1/2X4 (GAUZE/BANDAGES/DRESSINGS) ×3 IMPLANT
SUCTION FRAZIER HANDLE 10FR (MISCELLANEOUS) ×6
SUCTION FRAZIER TIP 8 FR DISP (SUCTIONS) ×3
SUCTION TUBE FRAZIER 10FR DISP (MISCELLANEOUS) ×4 IMPLANT
SUCTION TUBE FRAZIER 8FR DISP (SUCTIONS) ×2 IMPLANT
SUT ETHILON 2 0 PSLX (SUTURE) ×6 IMPLANT
SUT ETHILON 3 0 PS 1 (SUTURE) ×3 IMPLANT
SUT FIBERWIRE #2 38 T-5 BLUE (SUTURE) ×6
SUT MNCRL AB 3-0 PS2 27 (SUTURE) ×3 IMPLANT
SUT VIC AB 0 CT1 27 (SUTURE) ×6
SUT VIC AB 0 CT1 27XBRD ANBCTR (SUTURE) ×4 IMPLANT
SUT VIC AB 1 CT1 18XCR BRD 8 (SUTURE) ×2 IMPLANT
SUT VIC AB 1 CT1 27 (SUTURE) ×3
SUT VIC AB 1 CT1 27XBRD ANBCTR (SUTURE) ×2 IMPLANT
SUT VIC AB 1 CT1 36 (SUTURE) ×3 IMPLANT
SUT VIC AB 1 CT1 8-18 (SUTURE) ×3
SUT VIC AB 1 CTX 27 (SUTURE) ×3 IMPLANT
SUT VIC AB 2-0 CT1 27 (SUTURE) ×6
SUT VIC AB 2-0 CT1 TAPERPNT 27 (SUTURE) ×4 IMPLANT
SUTURE FIBERWR #2 38 T-5 BLUE (SUTURE) ×4 IMPLANT
SYR CONTROL 10ML LL (SYRINGE) IMPLANT
TOWEL GREEN STERILE (TOWEL DISPOSABLE) ×6 IMPLANT
TOWEL GREEN STERILE FF (TOWEL DISPOSABLE) ×6 IMPLANT
TRAY FOLEY MTR SLVR 16FR STAT (SET/KITS/TRAYS/PACK) IMPLANT
TUBE CONNECTING 12X1/4 (SUCTIONS) ×3 IMPLANT
WATER STERILE IRR 1000ML POUR (IV SOLUTION) ×3 IMPLANT

## 2020-05-13 NOTE — Transfer of Care (Signed)
Immediate Anesthesia Transfer of Care Note  Patient: Arthur Rios  Procedure(s) Performed: OPEN REDUCTION INTERNAL FIXATION (ORIF) ACETABULAR FRACTURE (Right Shoulder) OPEN REDUCTION INTERNAL FIXATION (ORIF) CLAVICULAR FRACTURE (Right )  Patient Location: PACU  Anesthesia Type:General  Level of Consciousness: awake, alert  and oriented  Airway & Oxygen Therapy: Patient Spontanous Breathing and Patient connected to nasal cannula oxygen  Post-op Assessment: Report given to RN and Post -op Vital signs reviewed and stable  Post vital signs: Reviewed and stable  Last Vitals:  Vitals Value Taken Time  BP 143/76 05/13/20 1304  Temp    Pulse 53 05/13/20 1306  Resp 18 05/13/20 1306  SpO2 100 % 05/13/20 1306  Vitals shown include unvalidated device data.  Last Pain:  Vitals:   05/13/20 0738  TempSrc:   PainSc: 0-No pain         Complications: No complications documented.

## 2020-05-13 NOTE — Anesthesia Postprocedure Evaluation (Signed)
Anesthesia Post Note  Patient: KYLIAN LOH  Procedure(s) Performed: OPEN REDUCTION INTERNAL FIXATION (ORIF) ACETABULAR FRACTURE (Right Shoulder) OPEN REDUCTION INTERNAL FIXATION (ORIF) CLAVICULAR FRACTURE (Right )     Patient location during evaluation: PACU Anesthesia Type: General Level of consciousness: awake and alert Pain management: pain level controlled Vital Signs Assessment: post-procedure vital signs reviewed and stable Respiratory status: spontaneous breathing, nonlabored ventilation, respiratory function stable and patient connected to nasal cannula oxygen Cardiovascular status: blood pressure returned to baseline and stable Postop Assessment: no apparent nausea or vomiting Anesthetic complications: no   No complications documented.  Last Vitals:  Vitals:   05/13/20 1350 05/13/20 1400  BP: (!) 153/82   Pulse: 60 64  Resp: 16 15  Temp: 36.6 C   SpO2: 93% 94%    Last Pain:  Vitals:   05/13/20 1350  TempSrc: Oral  PainSc: Asleep                 Shelton Silvas

## 2020-05-13 NOTE — Op Note (Signed)
05/13/2020  1:50 PM  PATIENT:  Arthur Rios  64 y.o. male  PRE-OPERATIVE DIAGNOSIS:   1. RIGHT ACETABULAR FRACTURE, ANTERIOR COLUMN POSTERIOR HEMITRANSVERSE 2. RIGHT COMMINUTED CLAVICLE FRACTURE  POST-OPERATIVE DIAGNOSIS:   1. RIGHT ACETABULAR FRACTURE, ANTERIOR COLUMN POSTERIOR HEMITRANSVERSE 2. RIGHT COMMINUTED CLAVICLE FRACTURE  PROCEDURE:  Procedure(s): 1. OPEN REDUCTION INTERNAL FIXATION (ORIF) ACETABULAR FRACTURE (Right) INVOLVING DOME/ BOTH COLUMNS 2. OPEN REDUCTION INTERNAL FIXATION (ORIF) CLAVICULAR FRACTURE (Right)  SURGEON:  Surgeon(s) and Role:    Myrene Galas, MD - Primary  PHYSICIAN ASSISTANT: KEITH PAUL,PA-C  ANESTHESIA:   general  EBL:  200 mL   BLOOD ADMINISTERED:none  DRAINS: none   LOCAL MEDICATIONS USED:  NONE  SPECIMEN:  No Specimen  DISPOSITION OF SPECIMEN:  N/A  COUNTS:  YES  TOURNIQUET:  * No tourniquets in log *  DICTATION: .Note written in EPIC  PLAN OF CARE: Admit to inpatient   PATIENT DISPOSITION:  PACU - hemodynamically stable.   Delay start of Pharmacological VTE agent (>24hrs) due to surgical blood loss or risk of bleeding: no  BRIEF SUMMARY OF INDICATION OF PROCEDURE:  Arthur Rios is a very pleasant 64 y.o.-year-old male who sustained a right acetabular fracture and a comminuted dominant side right clavicle fracture.  He was seen emergently in the ED and then plans were made to take him for subsequent ORIF of both injuries once resuscitation was complete and the fracture sites had had an opportunity to stabilize hemodynamically with clot.  I did discuss with the patient the risks and benefits of surgery including the potential for blood loss, infection, urologic injury, DVT, PE, heart attack, stroke, death, nerve and vessel injury, infection, and multiple others.  The patient acknowledged these risks and did wish to proceed.  BRIEF SUMMARY OF PROCEDURE:  The patient was taken to the operating room after  administration of antibiotics.  General anesthesia was induced. He was positioned supine with a bump under the operative side and the hip flexed 40 degrees.  A chlorhexidine scrub and then full Betadine scrub and paint were then applied to the abdomen and iliac wing areas.  Time-out was held.   A Pfannenstiel incision of 10 cm was made superior to the pelvic brim. Dissection was carried down to the pyramidalis, which was tagged.  A vertical limb was made through the linea alba and retention sutures placed in the distal aspect.  I carefully made my way around the left pelvic brim.  I did not identify any significant disruption of the pubic symphysis.  I did identify vessels that bridge between the obturator and epigastric system or the so-called corona mortis vessels.  Using several vascular clips, I secured these and continued along the brim with the help of my assistant who used  lighted long blunt Meyerding retractors.  We then were able to encounter the fracture site and associated hematoma, which was cleaned with curettes and lavage.  Using a ball spike pusher, I applied combined lateral and caudal force to the fracture and pinned it provisionally. This was followed by application of the suprapectineal plate from [employer], securing fixation anteriorly, and then with reapplication of the lateral and caudal force to push the fracture into a reduced position, securing standard screw fixation into the sciatic buttress and retroacetabular area above the joint line. This reduced the anterior column and the posterior column component.  I carefully placed screws into the anterior column above and below the fracture site and also secured screws into the most  inferior portions of the plate adjacent to the ischium.  Posteriorly, going back toward the sacrum, there was purchase obtained in the sciatric buttress. I checked AP and Judet films throughout and final films showed what appeared to be excellent  reduction of the articular surface of the posterior column, of the anterior column, and then also of the iliac wing.  The pelvis was irrigated thoroughly and then closed in standard layered fashion using #1 Vicryl at the pelvic brim, #1 for the deep subcu, 0 Vicryl, 2-0 Vicryl, and 3-0 nylon along the pelvic brim.  We repaired the fascia directly over the iliac crest, and then 0 Vicryl, 2-0 Vicryl, and 3-0 nylon, and then a Mepilex dressing.  A malleable retractor was used to protect the bladder at all times while in the pelvis.  Arthur Rios, PAC, assisted me throughout.  An assistant was absolutely necessary for the safe and effective completion of the case. There were no complications during the procedure.  Patient was then repositioned on the operative table so as to access the clavicle with C-arm.  The clavicular area from the neck to the sternum to the axilla was prepped and draped in the usual sterile fashion.  A superior approach was made after a timeout, carrying dissection carefully down to the periosteum, which was left intact to the bone fragments.  There was substantial comminution in all planes; coronal, sagittal and these fragments were cleaned of hematoma and reduced individually.  I was unable to secure lag fixation effectively because of transverse components to the fracture plane and the potential for fragmentation.  Consequently, I used pointed tenaculums to maintain provisional reduction while a bridge plate was applied using longest possible construct and securing 3 bicortical screws on either side of the fracture, 2 in standard fixation and 2 of locked on either side.  Final images  including caudal and cephalic tilt and AP views showed restoration of length, alignment, rotation and accepted position of all hardware.  Wound was irrigated thoroughly.  Arthur Morita, PA-C, assisted me throughout and assistance was necessary to maintain  control of the fracture and assist with exposure and  instrumentation.  Layered closure was performed  and a sterile gently compressive dressing applied.  PROGNOSIS:  Arthur Rios has sustained a complex injury and the repair has restored sufficient congruity and stability to his hip joint that he should be able to mobilize bed-to-chair with touchdown weightbearing on the left lower extremity for the next 8 weeks and graduated weightbearing for the ensuing 4 weeks.  He will be on formal pharmacologic DVT prophylaxis when deemed appropriate by the Trauma Service to restart that.  He is certainly at increased risk for perioperative complications given the extensive procedure, his comorbidities, as well as his age.  We will continue to follow him closely as he remains on the trauma service primarily. He will be weightbearing as tolerated through the right upper extremity with a sling for comfort. Gentle PROM and AROM of the shoulder and elbe may begin immediately with the assistance of PT and OT. Follow up in 10-14 days post discharge.    Doralee Albino. Carola Frost, M.D.
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
## 2020-05-13 NOTE — Anesthesia Procedure Notes (Signed)
Procedure Name: Intubation Date/Time: 05/13/2020 8:28 AM Performed by: Marena Chancy, CRNA Pre-anesthesia Checklist: Patient identified, Emergency Drugs available, Suction available and Patient being monitored Patient Re-evaluated:Patient Re-evaluated prior to induction Oxygen Delivery Method: Circle System Utilized Preoxygenation: Pre-oxygenation with 100% oxygen Induction Type: IV induction Ventilation: Mask ventilation without difficulty Laryngoscope Size: Miller and 2 Grade View: Grade II Tube type: Oral Tube size: 7.5 mm Number of attempts: 1 Airway Equipment and Method: Stylet and Oral airway Placement Confirmation: ETT inserted through vocal cords under direct vision,  positive ETCO2 and breath sounds checked- equal and bilateral Tube secured with: Tape Dental Injury: Teeth and Oropharynx as per pre-operative assessment

## 2020-05-13 NOTE — Progress Notes (Signed)
Pt arrived to rm 17 from PACU. Initiated tele. VSS. Assessment performed. Call bell within reach.   Lawson Radar, RN

## 2020-05-13 NOTE — Brief Op Note (Signed)
05/13/2020  1:50 PM  PATIENT:  Arthur Rios  64 y.o. male  PRE-OPERATIVE DIAGNOSIS:   1. RIGHT ACETABULAR FRACTURE, ANTERIOR COLUMN POSTERIOR HEMITRANSVERSE 2. RIGHT COMMINUTED CLAVICLE FRACTURE  POST-OPERATIVE DIAGNOSIS:   1. RIGHT ACETABULAR FRACTURE, ANTERIOR COLUMN POSTERIOR HEMITRANSVERSE 2. RIGHT COMMINUTED CLAVICLE FRACTURE  PROCEDURE:  Procedure(s): 1. OPEN REDUCTION INTERNAL FIXATION (ORIF) ACETABULAR FRACTURE (Right) INVOLVING BOTH COLUMNS 2. OPEN REDUCTION INTERNAL FIXATION (ORIF) CLAVICULAR FRACTURE (Right)  SURGEON:  Surgeon(s) and Role:    Myrene Galas, MD - Primary  PHYSICIAN ASSISTANT: KEITH PAUL,PA-C  ANESTHESIA:   general  EBL:  200 mL   BLOOD ADMINISTERED:none  DRAINS: none   LOCAL MEDICATIONS USED:  NONE  SPECIMEN:  No Specimen  DISPOSITION OF SPECIMEN:  N/A  COUNTS:  YES  TOURNIQUET:  * No tourniquets in log *  DICTATION: .Note written in EPIC  PLAN OF CARE: Admit to inpatient   PATIENT DISPOSITION:  PACU - hemodynamically stable.   Delay start of Pharmacological VTE agent (>24hrs) due to surgical blood loss or risk of bleeding: no

## 2020-05-13 NOTE — Anesthesia Preprocedure Evaluation (Addendum)
Anesthesia Evaluation  Patient identified by MRN, date of birth, ID band Patient awake    Reviewed: Allergy & Precautions, NPO status , Patient's Chart, lab work & pertinent test results  Airway Mallampati: II  TM Distance: >3 FB Neck ROM: Full    Dental  (+) Teeth Intact, Dental Advisory Given   Pulmonary neg pulmonary ROS,    breath sounds clear to auscultation       Cardiovascular hypertension, Pt. on medications  Rhythm:Regular Rate:Normal     Neuro/Psych negative neurological ROS  negative psych ROS   GI/Hepatic Neg liver ROS, GERD  Medicated,  Endo/Other  negative endocrine ROS  Renal/GU negative Renal ROS     Musculoskeletal   Abdominal Normal abdominal exam  (+)   Peds  Hematology negative hematology ROS (+)   Anesthesia Other Findings   Reproductive/Obstetrics                            Anesthesia Physical Anesthesia Plan  ASA: II  Anesthesia Plan: General   Post-op Pain Management:    Induction: Intravenous  PONV Risk Score and Plan: 3 and Ondansetron, Midazolam and Dexamethasone  Airway Management Planned: Oral ETT  Additional Equipment: None  Intra-op Plan:   Post-operative Plan: Extubation in OR  Informed Consent: I have reviewed the patients History and Physical, chart, labs and discussed the procedure including the risks, benefits and alternatives for the proposed anesthesia with the patient or authorized representative who has indicated his/her understanding and acceptance.     Dental advisory given  Plan Discussed with: CRNA  Anesthesia Plan Comments: (EKG: normal sinus rhythm.   Lab Results      Component                Value               Date                      WBC                      9.2                 05/12/2020                HGB                      11.3 (L)            05/12/2020                HCT                      34.3 (L)             05/12/2020                MCV                      89.1                05/12/2020                PLT                      164                 05/12/2020           )  Anesthesia Quick Evaluation  

## 2020-05-14 ENCOUNTER — Encounter (HOSPITAL_COMMUNITY): Payer: Self-pay

## 2020-05-14 DIAGNOSIS — E559 Vitamin D deficiency, unspecified: Secondary | ICD-10-CM

## 2020-05-14 DIAGNOSIS — S32401A Unspecified fracture of right acetabulum, initial encounter for closed fracture: Secondary | ICD-10-CM

## 2020-05-14 DIAGNOSIS — S42001A Fracture of unspecified part of right clavicle, initial encounter for closed fracture: Secondary | ICD-10-CM

## 2020-05-14 HISTORY — DX: Fracture of unspecified part of right clavicle, initial encounter for closed fracture: S42.001A

## 2020-05-14 HISTORY — DX: Unspecified fracture of right acetabulum, initial encounter for closed fracture: S32.401A

## 2020-05-14 HISTORY — DX: Vitamin D deficiency, unspecified: E55.9

## 2020-05-14 LAB — BASIC METABOLIC PANEL
Anion gap: 6 (ref 5–15)
BUN: 12 mg/dL (ref 8–23)
CO2: 25 mmol/L (ref 22–32)
Calcium: 7.9 mg/dL — ABNORMAL LOW (ref 8.9–10.3)
Chloride: 107 mmol/L (ref 98–111)
Creatinine, Ser: 0.77 mg/dL (ref 0.61–1.24)
GFR calc Af Amer: >60 mL/min (ref >60)
GFR calc non Af Amer: >60 mL/min (ref >60)
Glucose, Bld: 141 mg/dL — ABNORMAL HIGH (ref 70–99)
Potassium: 3.9 mmol/L (ref 3.5–5.1)
Sodium: 138 mmol/L (ref 135–145)

## 2020-05-14 LAB — CBC
HCT: 27.4 % — ABNORMAL LOW (ref 39.0–52.0)
Hemoglobin: 9.2 g/dL — ABNORMAL LOW (ref 13.0–17.0)
MCH: 29.6 pg (ref 26.0–34.0)
MCHC: 33.6 g/dL (ref 30.0–36.0)
MCV: 88.1 fL (ref 80.0–100.0)
Platelets: 191 10*3/uL (ref 150–400)
RBC: 3.11 MIL/uL — ABNORMAL LOW (ref 4.22–5.81)
RDW: 13.2 % (ref 11.5–15.5)
WBC: 11.3 10*3/uL — ABNORMAL HIGH (ref 4.0–10.5)
nRBC: 0 % (ref 0.0–0.2)

## 2020-05-14 MED ORDER — BETHANECHOL CHLORIDE 25 MG PO TABS
25.0000 mg | ORAL_TABLET | Freq: Three times a day (TID) | ORAL | Status: DC
  Administered 2020-05-14 – 2020-05-22 (×26): 25 mg via ORAL
  Filled 2020-05-14 (×26): qty 1

## 2020-05-14 MED ORDER — MAGIC MOUTHWASH W/LIDOCAINE
10.0000 mL | Freq: Three times a day (TID) | ORAL | Status: DC | PRN
  Administered 2020-05-14 – 2020-05-15 (×3): 10 mL via ORAL
  Filled 2020-05-14 (×5): qty 10

## 2020-05-14 MED ORDER — PHENOL 1.4 % MT LIQD
1.0000 | OROMUCOSAL | Status: DC | PRN
  Administered 2020-05-14: 1 via OROMUCOSAL
  Filled 2020-05-14: qty 177

## 2020-05-14 MED ORDER — TRAMADOL HCL 50 MG PO TABS
50.0000 mg | ORAL_TABLET | Freq: Four times a day (QID) | ORAL | Status: DC | PRN
  Administered 2020-05-16 – 2020-05-17 (×4): 50 mg via ORAL
  Filled 2020-05-14 (×5): qty 1

## 2020-05-14 MED ORDER — POLYETHYLENE GLYCOL 3350 17 G PO PACK
17.0000 g | PACK | Freq: Two times a day (BID) | ORAL | Status: DC
  Administered 2020-05-16 – 2020-05-21 (×6): 17 g via ORAL
  Filled 2020-05-14 (×9): qty 1

## 2020-05-14 MED ORDER — CHLORHEXIDINE GLUCONATE CLOTH 2 % EX PADS
6.0000 | MEDICATED_PAD | Freq: Every day | CUTANEOUS | Status: AC
  Administered 2020-05-14 – 2020-05-18 (×2): 6 via TOPICAL

## 2020-05-14 MED ORDER — BISACODYL 10 MG RE SUPP
10.0000 mg | Freq: Once | RECTAL | Status: AC
  Administered 2020-05-14: 10 mg via RECTAL
  Filled 2020-05-14: qty 1

## 2020-05-14 MED ORDER — MUPIROCIN 2 % EX OINT
1.0000 "application " | TOPICAL_OINTMENT | Freq: Two times a day (BID) | CUTANEOUS | Status: AC
  Administered 2020-05-14 – 2020-05-18 (×9): 1 via NASAL
  Filled 2020-05-14: qty 22
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
## 2020-05-14 NOTE — Progress Notes (Signed)
Rehab Admissions Coordinator Note:  Patient was screened by Clois Dupes for appropriateness for an Inpatient Acute Rehab Consult per therapy recs. .  At this time, we are recommending Inpatient Rehab consult. I will place order per protocol.  Clois Dupes RN MSN 05/14/2020, 1:52 PM  I can be reached at [phone].
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
## 2020-05-14 NOTE — Progress Notes (Signed)
D/c foley catheter per order. Pt tolerated well.   Lawson Radar, RN

## 2020-05-14 NOTE — Progress Notes (Signed)
Mobility Specialist: Progress Note   05/14/20 1453  Mobility  Activity Transferred:  Chair to bed  Level of Assistance +2 (takes two people)  Solicitor Response Tolerated well  Mobility performed by Mobility specialist  $Mobility charge 1 Mobility   Assisted PT w/ transferring pt from chair to bed. Pt was moderate assist to stand and contact guard/assist steadying during transfer.   [facility] Rosalva Neary Mobility Specialist

## 2020-05-15 LAB — CBC
HCT: 27.9 % — ABNORMAL LOW (ref 39.0–52.0)
Hemoglobin: 9.2 g/dL — ABNORMAL LOW (ref 13.0–17.0)
MCH: 29.1 pg (ref 26.0–34.0)
MCHC: 33 g/dL (ref 30.0–36.0)
MCV: 88.3 fL (ref 80.0–100.0)
Platelets: 213 10*3/uL (ref 150–400)
RBC: 3.16 MIL/uL — ABNORMAL LOW (ref 4.22–5.81)
RDW: 13.8 % (ref 11.5–15.5)
WBC: 12.2 10*3/uL — ABNORMAL HIGH (ref 4.0–10.5)
nRBC: 0 % (ref 0.0–0.2)

## 2020-05-15 MED ORDER — APIXABAN 5 MG PO TABS: 5.0000 mg | ORAL_TABLET | Freq: Two times a day (BID) | ORAL | Status: DC

## 2020-05-15 MED ORDER — APIXABAN 5 MG PO TABS
5.0000 mg | ORAL_TABLET | Freq: Two times a day (BID) | ORAL | Status: DC
  Administered 2020-05-15 – 2020-05-22 (×14): 5 mg via ORAL
  Filled 2020-05-15 (×14): qty 1

## 2020-05-15 NOTE — Progress Notes (Signed)
Inpatient Rehab Admissions:  Inpatient Rehab Consult received.  I met with patient at the bedside for rehabilitation assessment and to discuss goals and expectations of an inpatient rehab admission.  Pt acknowledged understanding of CIR goals and expectations. Pt would like to pursue CIR.  Pt appears to be an appropriate candidate for potential admission to CIR.  Will continue to monitor pt's medical workup and progress with therapies.  Pt gave permission to contact wife, Claiborne Billings.  Left message.  Awaiting return call.  Signed: Gayland Curry, [place], [place] Admissions Coordinator [phone]
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
## 2020-05-15 NOTE — Progress Notes (Signed)
Inpatient Rehab Admissions Coordinator:  Pt's wife, [name] returned call.  Explained CIR goals and expectations to her. She acknowledged understanding.  Reported she works daily until early afternoon.  Told AC family member could provide supervision for pt after d/c home.  She is trying to determine if physical assistance after d/c could be arranged for pt as well.  Will continue to follow pt.   Wolfgang Phoenix, MS, CCC-SLP Admissions Coordinator [phone]
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
## 2020-05-15 NOTE — Progress Notes (Signed)
Mobility Specialist: Progress Note   05/15/20 1542  Mobility  Activity Transferred:  Bed to chair;Transferred:  Chair to bed  Level of Assistance +2 (takes two people)  Press photographer wheel walker  Mobility Response Tolerated well  Mobility performed by Mobility specialist  Bed Position Semi-fowlers  $Mobility charge 1 Mobility   Assisted NT in transferring pt to the chair to sit up while he cleaned up. Came back to assist NT in transferring pt back to the bed. Pt tolerated transfer well.   [facility] Day Mobility Specialist
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
## 2020-05-16 NOTE — Plan of Care (Signed)
PT dropped off posterior hip precautions sheet per order, now taped above bed. PT will attempt to return for treatment if time allows.  Arlyss Gandy, PT, DPT Acute Rehabilitation Pager: [phone]
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
## 2020-05-16 NOTE — Discharge Instructions (Addendum)
Orthopaedic Trauma Service Discharge Instructions   General Discharge Instructions  Orthopaedic Injuries:  Right clavicle fracture treated with open reduction internal fixation using plate and screws             Right acetabular fracture treated with open reduction internal fixation using plate and screws  WEIGHT BEARING STATUS: Touchdown weightbearing right leg.  Weight-bear as tolerated right upper extremity but no lifting anything heavier than 5 pounds  RANGE OF MOTION/ACTIVITY: Unrestricted range of motion right shoulder.  Posterior hip precautions right hip.  Activity as tolerated while maintaining weightbearing restrictions  Bone health: Labs show vitamin D insufficiency.  Continue with vitamin D supplementation to promote bone healing  Wound Care: Daily wound care as needed starting as of now.  Okay to leave wounds open to the air and clean with soap and water.  You may cover them with Mepilex type dressing which is the base type dressing that you had on in the hospital.  Please see detailed instructions below   Discharge Wound Care Instructions  Do NOT apply any ointments, solutions or lotions to pin sites or surgical wounds.  These prevent needed drainage and even though solutions like hydrogen peroxide kill bacteria, they also damage cells lining the pin sites that help fight infection.  Applying lotions or ointments can keep the wounds moist and can cause them to breakdown and open up as well. This can increase the risk for infection. When in doubt call the office.  Surgical incisions should be dressed daily.  If any drainage is noted, use one layer of adaptic, then gauze and tape.  Alternatively you can use a Mepilex type dressing which is the base dressing you had on in the hospital  Once the incision is completely dry and without drainage, it may be left open to air out.  Showering may begin 36-48 hours later.  Cleaning gently with soap and water.    DVT/PE  prophylaxis: Eliquis 2.5 mg every 12 hours x 4 weeks for clot prevention.  Also move around as much as possible as this will help prevent blood clots from developing.  You can also use compression socks on both legs if so desired.  Would recommend if you note swelling in your legs  Diet: as you were eating previously.  Can use over the counter stool softeners and bowel preparations, such as Miralax, to help with bowel movements.  Narcotics can be constipating.  Be sure to drink plenty of fluids  PAIN MEDICATION USE AND EXPECTATIONS  You have likely been given narcotic medications to help control your pain.  After a traumatic event that results in an fracture (broken bone) with or without surgery, it is ok to use narcotic pain medications to help control one's pain.  We understand that everyone responds to pain differently and each individual patient will be evaluated on a regular basis for the continued need for narcotic medications. Ideally, narcotic medication use should last no more than 6-8 weeks (coinciding with fracture healing).   As a patient it is your responsibility as well to monitor narcotic medication use and report the amount and frequency you use these medications when you come to your office visit.   We would also advise that if you are using narcotic medications, you should take a dose prior to therapy to maximize you participation.  IF YOU ARE ON NARCOTIC MEDICATIONS IT IS NOT PERMISSIBLE TO OPERATE A MOTOR VEHICLE (MOTORCYCLE/CAR/TRUCK/MOPED) OR HEAVY MACHINERY DO NOT MIX NARCOTICS WITH OTHER CNS (  CENTRAL NERVOUS SYSTEM) DEPRESSANTS SUCH AS ALCOHOL   STOP SMOKING OR USING NICOTINE PRODUCTS!!!!  As discussed nicotine severely impairs your body's ability to heal surgical and traumatic wounds but also impairs bone healing.  Wounds and bone heal by forming microscopic blood vessels (angiogenesis) and nicotine is a vasoconstrictor (essentially, shrinks blood vessels).  Therefore, if  vasoconstriction occurs to these microscopic blood vessels they essentially disappear and are unable to deliver necessary nutrients to the healing tissue.  This is one modifiable factor that you can do to dramatically increase your chances of healing your injury.    (This means no smoking, no nicotine gum, patches, etc)  DO NOT USE NONSTEROIDAL ANTI-INFLAMMATORY DRUGS (NSAID'S)  Using products such as Advil (ibuprofen), Aleve (naproxen), Motrin (ibuprofen) for additional pain control during fracture healing can delay and/or prevent the healing response.  If you would like to take over the counter (OTC) medication, Tylenol (acetaminophen) is ok.  However, some narcotic medications that are given for pain control contain acetaminophen as well. Therefore, you should not exceed more than 4000 mg of tylenol in a day if you do not have liver disease.  Also note that there are may OTC medicines, such as cold medicines and allergy medicines that my contain tylenol as well.  If you have any questions about medications and/or interactions please ask your doctor/PA or your pharmacist.      ICE AND ELEVATE INJURED/OPERATIVE EXTREMITY  Using ice and elevating the injured extremity above your heart can help with swelling and pain control.  Icing in a pulsatile fashion, such as 20 minutes on and 20 minutes off, can be followed.    Do not place ice directly on skin. Make sure there is a barrier between to skin and the ice pack.    Using frozen items such as frozen peas works well as the conform nicely to the are that needs to be iced.  USE AN ACE WRAP OR TED HOSE FOR SWELLING CONTROL  In addition to icing and elevation, Ace wraps or TED hose are used to help limit and resolve swelling.  It is recommended to use Ace wraps or TED hose until you are informed to stop.    When using Ace Wraps start the wrapping distally (farthest away from the body) and wrap proximally (closer to the body)   Example: If you had surgery on  your leg or thing and you do not have a splint on, start the ace wrap at the toes and work your way up to the thigh        If you had surgery on your upper extremity and do not have a splint on, start the ace wrap at your fingers and work your way up to the upper arm  IF YOU ARE IN A SPLINT OR CAST DO NOT REMOVE IT FOR ANY REASON   If your splint gets wet for any reason please contact the office immediately. You may shower in your splint or cast as long as you keep it dry.  This can be done by wrapping in a cast cover or garbage back (or similar)  Do Not stick any thing down your splint or cast such as pencils, money, or hangers to try and scratch yourself with.  If you feel itchy take benadryl as prescribed on the bottle for itching  IF YOU ARE IN A CAM BOOT (BLACK BOOT)  You may remove boot periodically. Perform daily dressing changes as noted below.  Wash the liner of  the boot regularly and wear a sock when wearing the boot. It is recommended that you sleep in the boot until told otherwise    Call office for the following:  Temperature greater than 101F  Persistent nausea and vomiting  Severe uncontrolled pain  Redness, tenderness, or signs of infection (pain, swelling, redness, odor or green/yellow discharge around the site)  Difficulty breathing, headache or visual disturbances  Hives  Persistent dizziness or light-headedness  Extreme fatigue  Any other questions or concerns you may have after discharge  In an emergency, call 911 or go to an Emergency Department at a nearby hospital  HELPFUL INFORMATION  ? If you had a block, it will wear off between 8-24 hrs postop typically.  This is period when your pain may go from nearly zero to the pain you would have had postop without the block.  This is an abrupt transition but nothing dangerous is happening.  You may take an extra dose of narcotic when this happens.  ? You should wean off your narcotic medicines as soon as you are  able.  Most patients will be off or using minimal narcotics before their first postop appointment.   ? We suggest you use the pain medication the first night prior to going to bed, in order to ease any pain when the anesthesia wears off. You should avoid taking pain medications on an empty stomach as it will make you nauseous.  ? Do not drink alcoholic beverages or take illicit drugs when taking pain medications.  ? In most states it is against the law to drive while you are in a splint or sling.  And certainly against the law to drive while taking narcotics.  ? You may return to work/school in the next couple of days when you feel up to it.   ? Pain medication may make you constipated.  Below are a few solutions to try in this order: - Decrease the amount of pain medication if you aren't having pain. - Drink lots of decaffeinated fluids. - Drink prune juice and/or each dried prunes  o If the first 3 don't work start with additional solutions - Take Colace - an over-the-counter stool softener - Take Senokot - an over-the-counter laxative - Take Miralax - a stronger over-the-counter laxative     CALL THE OFFICE WITH ANY QUESTIONS OR CONCERNS: [phone]   VISIT OUR WEBSITE FOR ADDITIONAL INFORMATION: [email or web address]     Information on my medicine - ELIQUIS (apixaban)  Why was Eliquis prescribed for you? Eliquis was prescribed for you to reduce the risk of blood clots forming after orthopedic surgery.    What do You need to know about Eliquis? Take your Eliquis TWICE DAILY - one tablet in the morning and one tablet in the evening with or without food.  It would be best to take the dose about the same time each day.  If you have difficulty swallowing the tablet whole please discuss with your pharmacist how to take the medication safely.  Take Eliquis exactly as prescribed by your doctor and DO NOT stop taking Eliquis without talking to the doctor who prescribed the  medication.  Stopping without other medication to take the place of Eliquis may increase your risk of developing a clot.  After discharge, you should have regular check-up appointments with your healthcare provider that is prescribing your Eliquis.  What do you do if you miss a dose? If a dose of ELIQUIS is not taken at the scheduled time,  take it as soon as possible on the same day and twice-daily administration should be resumed.  The dose should not be doubled to make up for a missed dose.  Do not take more than one tablet of ELIQUIS at the same time.  Important Safety Information A possible side effect of Eliquis is bleeding. You should call your healthcare provider right away if you experience any of the following: ? Bleeding from an injury or your nose that does not stop. ? Unusual colored urine (red or dark brown) or unusual colored stools (red or black). ? Unusual bruising for unknown reasons. ? A serious fall or if you hit your head (even if there is no bleeding).  Some medicines may interact with Eliquis and might increase your risk of bleeding or clotting while on Eliquis. To help avoid this, consult your healthcare provider or pharmacist prior to using any new prescription or non-prescription medications, including herbals, vitamins, non-steroidal anti-inflammatory drugs (NSAIDs) and supplements.  This website has more information on Eliquis (apixaban): http://www.eliquis.com/eliquis/home
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
## 2020-05-17 ENCOUNTER — Encounter (HOSPITAL_COMMUNITY): Payer: Self-pay | Admitting: Orthopedic Surgery

## 2020-05-17 LAB — CBC
HCT: 31.6 % — ABNORMAL LOW (ref 39.0–52.0)
Hemoglobin: 10.6 g/dL — ABNORMAL LOW (ref 13.0–17.0)
MCH: 29.5 pg (ref 26.0–34.0)
MCHC: 33.5 g/dL (ref 30.0–36.0)
MCV: 88 fL (ref 80.0–100.0)
Platelets: 286 10*3/uL (ref 150–400)
RBC: 3.59 MIL/uL — ABNORMAL LOW (ref 4.22–5.81)
RDW: 13.6 % (ref 11.5–15.5)
WBC: 10.6 10*3/uL — ABNORMAL HIGH (ref 4.0–10.5)
nRBC: 0 % (ref 0.0–0.2)

## 2020-05-17 MED ORDER — BISACODYL 10 MG RE SUPP: 10.0000 mg | Freq: Every day | RECTAL | Status: DC | PRN
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
## 2020-05-17 NOTE — Progress Notes (Signed)
Transitions of Care Pharmacist Note  Arthur Rios is a 64 y.o. male requiring 4 weeks of DVT Prophylaxis per ortho and will be prescribed Eliquis (apixaban) at discharge.   Patient Education: I provided the following education on Eliquis to the patient: How to take the medication Described what the medication is Signs of bleeding Signs/symptoms of VTE and stroke  Answered their questions  Discharge Medications Plan: The patient is being discharged to inpatient rehab.   Thank you,   Lamar Sprinkles, PharmD PGY1 Pharmacy Resident 05/17/2020 5:29 PM  May 17, 2020

## 2020-05-17 NOTE — Progress Notes (Signed)
Inpatient Rehab Admissions Coordinator:  Saw pt at bedside.  Answered pt's questions regarding CIR expectations during and after CIR.  Pt acknowledged understanding.  Also informed pt there are no beds available today or tomorrow in CIR.  Will continue to follow.   Wolfgang Phoenix, MS, CCC-SLP Admissions Coordinator [phone]
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
## 2020-05-18 LAB — CBC
HCT: 31.7 % — ABNORMAL LOW (ref 39.0–52.0)
Hemoglobin: 10.7 g/dL — ABNORMAL LOW (ref 13.0–17.0)
MCH: 29.7 pg (ref 26.0–34.0)
MCHC: 33.8 g/dL (ref 30.0–36.0)
MCV: 88.1 fL (ref 80.0–100.0)
Platelets: 353 10*3/uL (ref 150–400)
RBC: 3.6 MIL/uL — ABNORMAL LOW (ref 4.22–5.81)
RDW: 14 % (ref 11.5–15.5)
WBC: 11.1 10*3/uL — ABNORMAL HIGH (ref 4.0–10.5)
nRBC: 0 % (ref 0.0–0.2)

## 2020-05-18 LAB — BASIC METABOLIC PANEL
Anion gap: 7 (ref 5–15)
BUN: 14 mg/dL (ref 8–23)
CO2: 26 mmol/L (ref 22–32)
Calcium: 8.3 mg/dL — ABNORMAL LOW (ref 8.9–10.3)
Chloride: 100 mmol/L (ref 98–111)
Creatinine, Ser: 0.75 mg/dL (ref 0.61–1.24)
GFR calc Af Amer: >60 mL/min (ref >60)
GFR calc non Af Amer: >60 mL/min (ref >60)
Glucose, Bld: 117 mg/dL — ABNORMAL HIGH (ref 70–99)
Potassium: 3.9 mmol/L (ref 3.5–5.1)
Sodium: 133 mmol/L — ABNORMAL LOW (ref 135–145)

## 2020-05-18 MED ORDER — BISACODYL 10 MG RE SUPP: 10.0000 mg | Freq: Once | RECTAL | Status: DC

## 2020-05-18 MED ORDER — TRAMADOL HCL 50 MG PO TABS
50.0000 mg | ORAL_TABLET | Freq: Four times a day (QID) | ORAL | Status: DC | PRN
  Administered 2020-05-19 – 2020-05-22 (×5): 50 mg via ORAL
  Filled 2020-05-18 (×5): qty 1

## 2020-05-18 MED ORDER — OXYCODONE HCL 5 MG PO TABS
5.0000 mg | ORAL_TABLET | ORAL | Status: DC | PRN
  Administered 2020-05-18 – 2020-05-19 (×5): 10 mg via ORAL
  Administered 2020-05-20 – 2020-05-21 (×5): 5 mg via ORAL
  Administered 2020-05-21: 10 mg via ORAL
  Administered 2020-05-22: 5 mg via ORAL
  Filled 2020-05-18 (×2): qty 2
  Filled 2020-05-18: qty 1
  Filled 2020-05-18: qty 2
  Filled 2020-05-18: qty 1
  Filled 2020-05-18 (×2): qty 2
  Filled 2020-05-18 (×2): qty 1
  Filled 2020-05-18 (×2): qty 2
  Filled 2020-05-18: qty 1
  Filled 2020-05-18: qty 2
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
## 2020-05-19 LAB — CBC
HCT: 31.3 % — ABNORMAL LOW (ref 39.0–52.0)
Hemoglobin: 10.3 g/dL — ABNORMAL LOW (ref 13.0–17.0)
MCH: 29.1 pg (ref 26.0–34.0)
MCHC: 32.9 g/dL (ref 30.0–36.0)
MCV: 88.4 fL (ref 80.0–100.0)
Platelets: 347 10*3/uL (ref 150–400)
RBC: 3.54 MIL/uL — ABNORMAL LOW (ref 4.22–5.81)
RDW: 14 % (ref 11.5–15.5)
WBC: 11.2 10*3/uL — ABNORMAL HIGH (ref 4.0–10.5)
nRBC: 0 % (ref 0.0–0.2)

## 2020-05-19 MED ORDER — SODIUM CHLORIDE 0.9 % IV SOLN: INTRAVENOUS | Status: DC
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
## 2020-05-19 NOTE — Progress Notes (Signed)
ANTICOAGULATION CONSULT NOTE - Follow Up Consult  Pharmacy Consult for Eliquis Indication: VTE prophylaxis  No Known Allergies  Patient Measurements: Height: 5\' 9"  (175.3 cm) Weight: 80.7 kg (178 lb) IBW/kg (Calculated) : 70.7  Vital Signs: Temp: 97.9 F (36.6 C) (09/15 0732) Temp Source: Oral (09/15 0732) BP: 148/87 (09/15 0732) Pulse Rate: 51 (09/15 0732)  Labs: Recent Labs    05/17/20 0316 05/17/20 0316 05/18/20 1029 05/19/20 0316  HGB 10.6*   < > 10.7* 10.3*  HCT 31.6*  --  31.7* 31.3*  PLT 286  --  353 347  CREATININE  --   --  0.75  --    < > = values in this interval not displayed.    Estimated Creatinine Clearance: 95.7 mL/min (by C-G formula based on SCr of 0.75 mg/dL).   Assessment: Anticoag: Eliquis for orthopedic VTE prophx. Hgb 10.3 stable. Scr <1  Goal of Therapy:  Therapeutic oral anticoagulation   Plan:  Apixaban for VTE ppx x 4 wks: 5mg  BID. Educated 9/13 Plan CIR   Arthur Kissick S. , PharmD, BCPS Clinical Staff Pharmacist [email or web address] 10/13, Arthur Rios Stillinger 05/19/2020,7:51 AM
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
## 2020-05-19 NOTE — Progress Notes (Signed)
Mobility Specialist - Progress Note   05/19/20 1400  Mobility  Activity Stood at bedside  Level of Assistance Modified independent, requires aide device or extra time  Assistive Device Front wheel walker  Mobility Response Tolerated fair  Mobility performed by Mobility specialist  $Mobility charge 1 Mobility    Saw pt at request of his RN. Pt able to stand up on his own while adhering to precautions. He c/o feeling lightheaded after standing w/ first BP being 106/70 after sitting down and 107/74 after several minutes of rest. Pt declined any further mobility, but says he is feeling encouraged w/ continued mobility.   Arthur Rios Mobility Specialist Mobility Specialist Phone: [phone]

## 2020-05-20 ENCOUNTER — Encounter (HOSPITAL_COMMUNITY): Payer: Self-pay

## 2020-05-20 MED ORDER — SODIUM CHLORIDE 0.9 % IV BOLUS
500.0000 mL | Freq: Once | INTRAVENOUS | Status: AC
  Administered 2020-05-20: 500 mL via INTRAVENOUS

## 2020-05-20 NOTE — Progress Notes (Signed)
Inpatient Rehab Admissions Coordinator:   I met with patient at bedside to notify him that I do not have a CIR bed available for this pt. Today. We are awaiting insurance authorization.   Clemens Catholic, [place], [place] Admissions Coordinator  [phone] ([place]) [phone] (office)
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
## 2020-05-21 NOTE — Progress Notes (Signed)
Inpatient Rehab Admissions Coordinator:   I am still awaiting insurance authorization for CIR admission for this Pt. Pt. Was previously told he would have a decision by 5 pm today, but at 5:10 pm, Pt.'s authorization was still under review. I no longer have a bed to offer pt on CIR over the weekend, but will follow for potential admission if he gets insurance approval over the weekend and I have an unexpected bed open up.   Megan Salon, MS, CCC-SLP Rehab Admissions Coordinator  [phone] (celll) [phone] (office)
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
## 2020-05-21 NOTE — Progress Notes (Signed)
ANTICOAGULATION CONSULT NOTE - Follow Up Consult  Pharmacy Consult for Eliquis Indication: VTE prophylaxis  No Known Allergies  Patient Measurements: Height: 5\' 9"  (175.3 cm) Weight: 80.7 kg (178 lb) IBW/kg (Calculated) : 70.7  Vital Signs: Temp: 98.2 F (36.8 C) (09/17 0836) Temp Source: Oral (09/17 0836) BP: 133/87 (09/17 0836) Pulse Rate: 60 (09/17 0836)  Labs: Recent Labs    05/19/20 0316  HGB 10.3*  HCT 31.3*  PLT 347    Estimated Creatinine Clearance: 95.7 mL/min (by C-G formula based on SCr of 0.75 mg/dL).   Assessment: Anticoag: Eliquis for orthopedic VTE prophx x 4 weeks (9/11-->10/9)  Goal of Therapy:  Therapeutic oral anticoagulation   Plan:  Apixaban for VTE ppx x 4 wks: 5mg  BID. Educated 9/13 Plan CIR (awaiting insurance authorization and bed) Pharmacy will sign off.   Thank you for allowing to participate in this patients care.   10/13, PharmD Please see amion for complete clinical pharmacist phone list. 05/21/2020 10:30 AM
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
## 2020-05-22 ENCOUNTER — Inpatient Hospital Stay (HOSPITAL_COMMUNITY)
Admission: RE | Admit: 2020-05-22 | Discharge: 2020-06-01 | DRG: 560 | Disposition: A | Discharge status: Home / Self Care | Payer: 59 | Source: Intra-hospital | Attending: Physical Medicine & Rehabilitation | Admitting: Physical Medicine & Rehabilitation

## 2020-05-22 ENCOUNTER — Encounter (HOSPITAL_COMMUNITY): Payer: Self-pay | Admitting: Physical Medicine & Rehabilitation

## 2020-05-22 ENCOUNTER — Other Ambulatory Visit: Payer: Self-pay

## 2020-05-22 DIAGNOSIS — K5903 Drug induced constipation: Secondary | ICD-10-CM | POA: Diagnosis not present

## 2020-05-22 DIAGNOSIS — S32301D Unspecified fracture of right ilium, subsequent encounter for fracture with routine healing: Secondary | ICD-10-CM | POA: Diagnosis not present

## 2020-05-22 DIAGNOSIS — R339 Retention of urine, unspecified: Secondary | ICD-10-CM

## 2020-05-22 DIAGNOSIS — S2241XD Multiple fractures of ribs, right side, subsequent encounter for fracture with routine healing: Secondary | ICD-10-CM

## 2020-05-22 DIAGNOSIS — S32491S Other specified fracture of right acetabulum, sequela: Secondary | ICD-10-CM | POA: Diagnosis not present

## 2020-05-22 DIAGNOSIS — Z7409 Other reduced mobility: Secondary | ICD-10-CM | POA: Diagnosis not present

## 2020-05-22 DIAGNOSIS — S2249XD Multiple fractures of ribs, unspecified side, subsequent encounter for fracture with routine healing: Secondary | ICD-10-CM | POA: Diagnosis not present

## 2020-05-22 DIAGNOSIS — S32592D Other specified fracture of left pubis, subsequent encounter for fracture with routine healing: Secondary | ICD-10-CM | POA: Diagnosis not present

## 2020-05-22 DIAGNOSIS — Z741 Need for assistance with personal care: Secondary | ICD-10-CM | POA: Diagnosis not present

## 2020-05-22 DIAGNOSIS — S42001A Fracture of unspecified part of right clavicle, initial encounter for closed fracture: Secondary | ICD-10-CM | POA: Diagnosis present

## 2020-05-22 DIAGNOSIS — E871 Hypo-osmolality and hyponatremia: Secondary | ICD-10-CM | POA: Diagnosis present

## 2020-05-22 DIAGNOSIS — G8918 Other acute postprocedural pain: Secondary | ICD-10-CM | POA: Diagnosis present

## 2020-05-22 DIAGNOSIS — E559 Vitamin D deficiency, unspecified: Secondary | ICD-10-CM | POA: Diagnosis present

## 2020-05-22 DIAGNOSIS — S32401A Unspecified fracture of right acetabulum, initial encounter for closed fracture: Secondary | ICD-10-CM | POA: Diagnosis present

## 2020-05-22 DIAGNOSIS — D62 Acute posthemorrhagic anemia: Secondary | ICD-10-CM | POA: Diagnosis present

## 2020-05-22 DIAGNOSIS — S2249XA Multiple fractures of ribs, unspecified side, initial encounter for closed fracture: Secondary | ICD-10-CM | POA: Diagnosis present

## 2020-05-22 DIAGNOSIS — S32591D Other specified fracture of right pubis, subsequent encounter for fracture with routine healing: Secondary | ICD-10-CM | POA: Diagnosis not present

## 2020-05-22 DIAGNOSIS — S32491D Other specified fracture of right acetabulum, subsequent encounter for fracture with routine healing: Secondary | ICD-10-CM | POA: Diagnosis not present

## 2020-05-22 DIAGNOSIS — I951 Orthostatic hypotension: Secondary | ICD-10-CM | POA: Diagnosis present

## 2020-05-22 DIAGNOSIS — Z79899 Other long term (current) drug therapy: Secondary | ICD-10-CM

## 2020-05-22 DIAGNOSIS — S42001D Fracture of unspecified part of right clavicle, subsequent encounter for fracture with routine healing: Secondary | ICD-10-CM | POA: Diagnosis not present

## 2020-05-22 DIAGNOSIS — S32401D Unspecified fracture of right acetabulum, subsequent encounter for fracture with routine healing: Secondary | ICD-10-CM | POA: Diagnosis present

## 2020-05-22 MED ORDER — ALUM & MAG HYDROXIDE-SIMETH 200-200-20 MG/5ML PO SUSP: 30.0000 mL | ORAL | Status: DC | PRN

## 2020-05-22 MED ORDER — ACETAMINOPHEN 325 MG PO TABS
325.0000 mg | ORAL_TABLET | ORAL | Status: DC | PRN
  Administered 2020-05-23 – 2020-05-24 (×2): 650 mg via ORAL
  Filled 2020-05-22: qty 2

## 2020-05-22 MED ORDER — METHOCARBAMOL 500 MG PO TABS
500.0000 mg | ORAL_TABLET | Freq: Four times a day (QID) | ORAL | Status: DC | PRN
  Administered 2020-05-29 – 2020-05-30 (×2): 500 mg via ORAL
  Filled 2020-05-22 (×2): qty 1

## 2020-05-22 MED ORDER — APIXABAN 5 MG PO TABS
5.0000 mg | ORAL_TABLET | Freq: Two times a day (BID) | ORAL | Status: DC
  Administered 2020-05-22 – 2020-05-25 (×6): 5 mg via ORAL
  Filled 2020-05-22 (×6): qty 1

## 2020-05-22 MED ORDER — BETHANECHOL CHLORIDE 25 MG PO TABS
25.0000 mg | ORAL_TABLET | Freq: Three times a day (TID) | ORAL | Status: DC
  Administered 2020-05-22: 25 mg via ORAL
  Filled 2020-05-22: qty 1

## 2020-05-22 MED ORDER — ONDANSETRON HCL 4 MG/2ML IJ SOLN: 4.0000 mg | Freq: Four times a day (QID) | INTRAMUSCULAR | Status: DC | PRN

## 2020-05-22 MED ORDER — TRAMADOL HCL 50 MG PO TABS
50.0000 mg | ORAL_TABLET | Freq: Four times a day (QID) | ORAL | Status: DC | PRN
  Administered 2020-05-22 – 2020-05-27 (×12): 50 mg via ORAL
  Filled 2020-05-22 (×12): qty 1

## 2020-05-22 MED ORDER — SENNOSIDES-DOCUSATE SODIUM 8.6-50 MG PO TABS: 1.0000 | ORAL_TABLET | Freq: Every evening | ORAL | Status: DC | PRN

## 2020-05-22 MED ORDER — GABAPENTIN 300 MG PO CAPS
300.0000 mg | ORAL_CAPSULE | Freq: Three times a day (TID) | ORAL | Status: DC
  Administered 2020-05-22 – 2020-06-01 (×29): 300 mg via ORAL
  Filled 2020-05-22 (×29): qty 1

## 2020-05-22 MED ORDER — ONDANSETRON HCL 4 MG PO TABS: 4.0000 mg | ORAL_TABLET | Freq: Four times a day (QID) | ORAL | Status: DC | PRN

## 2020-05-22 MED ORDER — OXYCODONE HCL 5 MG PO TABS
5.0000 mg | ORAL_TABLET | ORAL | Status: DC | PRN
  Administered 2020-05-27: 10 mg via ORAL
  Administered 2020-05-29 – 2020-05-31 (×2): 5 mg via ORAL
  Filled 2020-05-22: qty 2
  Filled 2020-05-22 (×3): qty 1
  Filled 2020-05-22: qty 2
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
## 2020-05-22 NOTE — Plan of Care (Signed)
  Problem: Consults Goal: RH GENERAL PATIENT EDUCATION Description: See Patient Education module for education specifics. Outcome: Progressing   Problem: RH SKIN INTEGRITY Goal: RH STG SKIN FREE OF INFECTION/BREAKDOWN Description: No skin breakdown this admission with mod I assist Outcome: Progressing Goal: RH STG MAINTAIN SKIN INTEGRITY WITH ASSISTANCE Description: STG Maintain Skin Integrity With mod I Assistance. Outcome: Progressing Goal: RH STG ABLE TO PERFORM INCISION/WOUND CARE W/ASSISTANCE Description: STG Able To Perform Incision/Wound Care With mod I Assistance. Outcome: Progressing   Problem: RH SAFETY Goal: RH STG ADHERE TO SAFETY PRECAUTIONS W/ASSISTANCE/DEVICE Description: STG Adhere to Safety Precautions With mod I Assistance/Device. Outcome: Progressing   Problem: RH PAIN MANAGEMENT Goal: RH STG PAIN MANAGED AT OR BELOW PT'S PAIN GOAL Description: Less than 3 out of 10 Outcome: Progressing
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
## 2020-05-22 NOTE — Progress Notes (Signed)
Inpatient Rehab Admissions Coordinator:   I have a bed on CIR for this patient today and have received authorization through his insurance. I will plan to admit to CIR today.   Megan Salon, MS, CCC-SLP Rehab Admissions Coordinator  [phone] (celll) [phone] (office)
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
## 2020-05-23 ENCOUNTER — Inpatient Hospital Stay (HOSPITAL_COMMUNITY): Payer: 59 | Admitting: Occupational Therapy

## 2020-05-23 ENCOUNTER — Inpatient Hospital Stay (HOSPITAL_COMMUNITY): Payer: 59 | Admitting: Physical Therapy

## 2020-05-23 DIAGNOSIS — S32401D Unspecified fracture of right acetabulum, subsequent encounter for fracture with routine healing: Principal | ICD-10-CM

## 2020-05-23 LAB — BASIC METABOLIC PANEL
Anion gap: 7 (ref 5–15)
BUN: 11 mg/dL (ref 8–23)
CO2: 26 mmol/L (ref 22–32)
Calcium: 8.3 mg/dL — ABNORMAL LOW (ref 8.9–10.3)
Chloride: 100 mmol/L (ref 98–111)
Creatinine, Ser: 0.73 mg/dL (ref 0.61–1.24)
GFR calc Af Amer: >60 mL/min (ref >60)
GFR calc non Af Amer: >60 mL/min (ref >60)
Glucose, Bld: 158 mg/dL — ABNORMAL HIGH (ref 70–99)
Potassium: 3.5 mmol/L (ref 3.5–5.1)
Sodium: 133 mmol/L — ABNORMAL LOW (ref 135–145)

## 2020-05-23 LAB — CBC WITH DIFFERENTIAL/PLATELET
Abs Immature Granulocytes: 0.06 10*3/uL (ref 0.00–0.07)
Basophils Absolute: 0.1 10*3/uL (ref 0.0–0.1)
Basophils Relative: 1 %
Eosinophils Absolute: 0.1 10*3/uL (ref 0.0–0.5)
Eosinophils Relative: 1 %
HCT: 32.1 % — ABNORMAL LOW (ref 39.0–52.0)
Hemoglobin: 10.4 g/dL — ABNORMAL LOW (ref 13.0–17.0)
Immature Granulocytes: 1 %
Lymphocytes Relative: 15 %
Lymphs Abs: 1.5 10*3/uL (ref 0.7–4.0)
MCH: 28.8 pg (ref 26.0–34.0)
MCHC: 32.4 g/dL (ref 30.0–36.0)
MCV: 88.9 fL (ref 80.0–100.0)
Monocytes Absolute: 0.7 10*3/uL (ref 0.1–1.0)
Monocytes Relative: 7 %
Neutro Abs: 7.2 10*3/uL (ref 1.7–7.7)
Neutrophils Relative %: 75 %
Platelets: 331 10*3/uL (ref 150–400)
RBC: 3.61 MIL/uL — ABNORMAL LOW (ref 4.22–5.81)
RDW: 14.1 % (ref 11.5–15.5)
WBC: 9.6 10*3/uL (ref 4.0–10.5)
nRBC: 0 % (ref 0.0–0.2)

## 2020-05-23 MED ORDER — VITAMIN D 25 MCG (1000 UNIT) PO TABS
2000.0000 [IU] | ORAL_TABLET | Freq: Two times a day (BID) | ORAL | Status: DC
  Administered 2020-05-23 – 2020-06-01 (×18): 2000 [IU] via ORAL
  Filled 2020-05-23 (×18): qty 2

## 2020-05-23 MED ORDER — SENNOSIDES-DOCUSATE SODIUM 8.6-50 MG PO TABS
1.0000 | ORAL_TABLET | Freq: Two times a day (BID) | ORAL | Status: DC
  Administered 2020-05-23 – 2020-05-24 (×3): 1 via ORAL
  Filled 2020-05-23 (×3): qty 1

## 2020-05-23 MED ORDER — BETHANECHOL CHLORIDE 10 MG PO TABS
10.0000 mg | ORAL_TABLET | Freq: Three times a day (TID) | ORAL | Status: DC
  Administered 2020-05-23 – 2020-05-25 (×7): 10 mg via ORAL
  Filled 2020-05-23 (×7): qty 1

## 2020-05-23 MED ORDER — ASCORBIC ACID 500 MG PO TABS
1000.0000 mg | ORAL_TABLET | Freq: Every day | ORAL | Status: DC
  Administered 2020-05-23 – 2020-06-01 (×10): 1000 mg via ORAL
  Filled 2020-05-23 (×11): qty 2

## 2020-05-23 MED ORDER — ADULT MULTIVITAMIN W/MINERALS CH
1.0000 | ORAL_TABLET | Freq: Every day | ORAL | Status: DC
  Administered 2020-05-23 – 2020-06-01 (×10): 1 via ORAL
  Filled 2020-05-23 (×10): qty 1

## 2020-05-23 MED ORDER — PANTOPRAZOLE SODIUM 40 MG PO TBEC
40.0000 mg | DELAYED_RELEASE_TABLET | Freq: Every day | ORAL | Status: DC
  Administered 2020-05-23 – 2020-06-01 (×10): 40 mg via ORAL
  Filled 2020-05-23 (×10): qty 1
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
## 2020-05-23 NOTE — Plan of Care (Signed)
  Problem: Sit to Stand Goal: LTG:  Patient will perform sit to stand in prep for activites of daily living with assistance level (OT) Description: LTG:  Patient will perform sit to stand in prep for activites of daily living with assistance level (OT) Flowsheets (Taken 05/23/2020 1606) LTG: PT will perform sit to stand in prep for activites of daily living with assistance level: Independent with assistive device   Problem: RH Grooming Goal: LTG Patient will perform grooming w/assist,cues/equip (OT) Description: LTG: Patient will perform grooming with assist, with/without cues using equipment (OT) Flowsheets (Taken 05/23/2020 1606) LTG: Pt will perform grooming with assistance level of: Independent with assistive device    Problem: RH Bathing Goal: LTG Patient will bathe all body parts with assist levels (OT) Description: LTG: Patient will bathe all body parts with assist levels (OT) Flowsheets (Taken 05/23/2020 1606) LTG: Pt will perform bathing with assistance level/cueing: Set up assist    Problem: RH Dressing Goal: LTG Patient will perform upper body dressing (OT) Description: LTG Patient will perform upper body dressing with assist, with/without cues (OT). Flowsheets (Taken 05/23/2020 1606) LTG: Pt will perform upper body dressing with assistance level of: Independent with assistive device Goal: LTG Patient will perform lower body dressing w/assist (OT) Description: LTG: Patient will perform lower body dressing with assist, with/without cues in positioning using equipment (OT) Flowsheets (Taken 05/23/2020 1606) LTG: Pt will perform lower body dressing with assistance level of: Independent with assistive device   Problem: RH Toileting Goal: LTG Patient will perform toileting task (3/3 steps) with assistance level (OT) Description: LTG: Patient will perform toileting task (3/3 steps) with assistance level (OT)  Flowsheets (Taken 05/23/2020 1606) LTG: Pt will perform toileting task (3/3  steps) with assistance level: Independent with assistive device   Problem: RH Toilet Transfers Goal: LTG Patient will perform toilet transfers w/assist (OT) Description: LTG: Patient will perform toilet transfers with assist, with/without cues using equipment (OT) Flowsheets (Taken 05/23/2020 1606) LTG: Pt will perform toilet transfers with assistance level of: Independent with assistive device   Problem: RH Tub/Shower Transfers Goal: LTG Patient will perform tub/shower transfers w/assist (OT) Description: LTG: Patient will perform tub/shower transfers with assist, with/without cues using equipment (OT) Flowsheets (Taken 05/23/2020 1606) LTG: Pt will perform tub/shower stall transfers with assistance level of: Set up assist
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
## 2020-05-23 NOTE — Progress Notes (Addendum)
Inpatient Rehabilitation Medication Review by a Pharmacist  A complete drug regimen review was completed for this patient to identify any potential clinically significant medication issues.  Clinically significant medication issues were identified:  yes   Type of Medication Issue Identified Description of Issue Urgent (address now) Non-Urgent (address on AM team rounds) Plan Plan Accepted by Provider? (Yes / No / Pending AM Rounds)  Drug Interaction(s) (clinically significant)       Duplicate Therapy       Allergy       No Medication Administration End Date  Apixaban for VTE prophyalxis for 4 weeks, anticipated end date is 06/12/20 (end date not in [facility]) Non-urgent Ensure medication has appropriate end date at discharge if prior to 10/9. Ensure medication has end date added if still admitted closer to 10/9.   Incorrect Dose       Additional Drug Therapy Needed  Medications not ordered on admission not yet addressed by MD: Pantoprazole 40 mg daily during hospitalization (omeprazole prior to admission - Ergocalciferol 50,000 units every 7 days (last dose 9/16) - Cholecalciferol 2,000 units twice daily - Ascorbic acid 1,000 mg daily - Multivitamin 1 tab daily Non-urgent Will message provider to clarify Yes for all except do not order ergocalciferol (per Dr. Wynn Banker 9/19)  Other  Meds prior to admission not currently ordered: - fluticasone nasal spray - indomethacin - lisinopril/HCTZ (holding for hypotension) - modafinil (pt requested to not have while inpatient) Non-urgent Ensure medications are reordered at discharge if appropriate     For non-urgent medication issues to be resolved on team rounds tomorrow morning a [facility] Secure Chat Handoff was sent to: Erick Colace, MD   Pharmacist comments:   Time spent performing this drug regimen review (minutes):  30   Helina Hullum K Janelli Welling 05/23/2020 1:15 PM

## 2020-05-24 ENCOUNTER — Inpatient Hospital Stay (HOSPITAL_COMMUNITY): Payer: 59 | Admitting: Occupational Therapy

## 2020-05-24 ENCOUNTER — Inpatient Hospital Stay (HOSPITAL_COMMUNITY): Payer: 59

## 2020-05-24 MED ORDER — ACETAMINOPHEN 325 MG PO TABS
650.0000 mg | ORAL_TABLET | Freq: Three times a day (TID) | ORAL | Status: DC
  Administered 2020-05-24 – 2020-06-01 (×24): 650 mg via ORAL
  Filled 2020-05-24 (×24): qty 2

## 2020-05-24 MED ORDER — SENNOSIDES-DOCUSATE SODIUM 8.6-50 MG PO TABS
1.0000 | ORAL_TABLET | Freq: Every day | ORAL | Status: DC
  Filled 2020-05-24: qty 1

## 2020-05-24 NOTE — Progress Notes (Signed)
Inpatient Rehabilitation Center Individual Statement of Services  Patient Name:  Arthur Rios  Date:  05/24/2020  Welcome to the Inpatient Rehabilitation Center.  Our goal is to provide you with an individualized program based on your diagnosis and situation, designed to meet your specific needs.  With this comprehensive rehabilitation program, you will be expected to participate in at least 3 hours of rehabilitation therapies Monday-Friday, with modified therapy programming on the weekends.  Your rehabilitation program will include the following services:  Physical Therapy (PT), Occupational Therapy (OT), 24 hour per day rehabilitation nursing, Care Coordinator, Rehabilitation Medicine, Nutrition Services and Pharmacy Services  Weekly team conferences will be held on Wednesday to discuss your progress.  Your Inpatient Rehabilitation Care Coordinator will talk with you frequently to get your input and to update you on team discussions.  Team conferences with you and your family in attendance may also be held.  Expected length of stay: 7-10 days  Overall anticipated outcome: independent-supervision level  Depending on your progress and recovery, your program may change. Your Inpatient Rehabilitation Care Coordinator will coordinate services and will keep you informed of any changes. Your Inpatient Rehabilitation Care Coordinator's name and contact numbers are listed  below.  The following services may also be recommended but are not provided by the Inpatient Rehabilitation Center:   Driving Evaluations  Home Health Rehabiltiation Services  Outpatient Rehabilitation Services  Vocational Rehabilitation   Arrangements will be made to provide these services after discharge if needed.  Arrangements include referral to agencies that provide these services.  Your insurance has been verified to be:  Community education officer Your primary doctor is:  none Pertinent information will be shared with your doctor and  your insurance company.  Inpatient Rehabilitation Care Coordinator:  Dossie Der, [place] [phone] or Luna [place]  Information discussed with and copy given to patient by: Lucy Chris, 05/24/2020, 9:55 AM
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
## 2020-05-24 NOTE — Progress Notes (Signed)
Inpatient Rehabilitation  Patient information reviewed and entered into eRehab system by Avish Torry M. Tiona Ruane, M.A., CCC/SLP, PPS Coordinator.  Information including medical coding, functional ability and quality indicators will be reviewed and updated through discharge.    
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
## 2020-05-24 NOTE — Progress Notes (Signed)
Physical Therapy Session Note  Patient Details  Name: Arthur Rios MRN: [id number] Date of Birth: 12/11/1955  Today's Date: 05/24/2020 PT Individual Time: 1100-1155 PT Individual Time Calculation (min): 55 min   Short Term Goals: Week 1:  PT Short Term Goal 1 (Week 1): STG=LTG due to LOS.  Skilled Therapeutic Interventions/Progress Updates:    Pt received sitting in w/c, awake and agreeable to PT session. Pt reports unrated R rib rx and RUE pain, reports recent pain medication from nursing prior to PT arrival. Frequent rest breaks provided during session for pain management. Pt able to recall 3/3 hip precautions without cueing. WC transport with totalA for time management from room to main therapy gym. Sit<>stand from w/c level to RW with minA and performed 1x10 LLE heel raises with CGA and RW. After seated rest, performed gait training x73ft with CGA and RW, pt demo's NWB RLE technique with hop-to gait pattern. Pt then c/o moderate dizziness and reports seeing "flashes." Returned seated and BP assessed (see below). After extended seated rest break, performed standing hops onto 4inch platform with LLE with minA and RW, increased difficulty stepping down backwards from platform than hopping up. Pt performed x5 hops and then began to feel more significantly dizzy, required an additional extended seated breat break with guided breathing for symptoms to resolve. Pt deferring further standing tasks 2/2 concern for low BP, therefore performed seated there-ex including 3x10 LAQ with 4# ankle weight + 3x10 hip marches with 4# ankle weight and therapist assisting with R hip flexion to tolerance (maintaining hip precautions). WC transport back to his room where he remained seated in w/c at end of session with needs in reach, chair alarm on.   Seated: 109/72 (83) HR 61 Standing: 108/69 (88) HR 84  Therapy Documentation Precautions:  Precautions Precautions: Fall, Posterior Hip Precaution Comments: right  5th - 10th rib fxs Restrictions Weight Bearing Restrictions: Yes RUE Weight Bearing: Weight bearing as tolerated RLE Weight Bearing: Touchdown weight bearing Other Position/Activity Restrictions: Pt likes to maintain NWB RLE.  Therapy/Group: Individual Therapy  Rishon Thilges P Juandaniel Manfredo PT 05/24/2020, 11:24 AM

## 2020-05-24 NOTE — Progress Notes (Signed)
Patient Details  Name: SELVIN YUN MRN: [id number] Date of Birth: [birth year]  Today's Date: 05/24/2020  Hospital Problems: Principal Problem:   Acetabulum fracture, right [facility]) Active Problems:   Rib fractures   Right clavicle fracture   Right acetabular fracture [facility])  Past Medical History:  Past Medical History:  Diagnosis Date  . Acetabulum fracture, right (HCC) 05/14/2020  . Right clavicle fracture 05/14/2020  . Vitamin D insufficiency 05/14/2020   Past Surgical History:  Past Surgical History:  Procedure Laterality Date  . ORIF ACETABULAR FRACTURE Right 05/13/2020   Procedure: OPEN REDUCTION INTERNAL FIXATION (ORIF) ACETABULAR FRACTURE;  Surgeon: Myrene Galas, MD;  Location: [facility] OR;  Service: Orthopedics;  Laterality: Right;  . ORIF CLAVICULAR FRACTURE Right 05/13/2020   Procedure: OPEN REDUCTION INTERNAL FIXATION (ORIF) CLAVICULAR FRACTURE;  Surgeon: Myrene Galas, MD;  Location: [facility] OR;  Service: Orthopedics;  Laterality: Right;   Social History:  reports that he has never smoked. He has never used smokeless tobacco. No history on file for alcohol use and drug use.  Family / Support Systems Marital Status: Married Patient Roles: Spouse, Parent, Other (Comment) (employee) Spouse/Significant Other: Kelly-wife  [phone] Children: son in the home Other Supports: freinds, co-workers and extended family Anticipated Caregiver: [name] Ability/Limitations of Caregiver: kelly works out of the home, but has planned to work form [employer] for the first two weeks pt is home from the hospital Caregiver Availability: 24/7 (2 weeks then just evenings) Family Dynamics: Close knit family who will pull together when one of them is in need. 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 He is in less pain and feels this allows him to push himself harder in therapies. He has always been one to take care of things and this is hard for him. Recent Psychosocial Issues: other health issues-tries to take care of himself and be active Psychiatric History: No issues-deferred depression screen due to coping appropriately and doing well with his hospitalization Substance Abuse History: No issues  Patient / Family Perceptions, Expectations & Goals Pt/Family understanding of illness & functional limitations: Pt and wife can explain his injuries and fractures and WB issues. He does talk with the MD daily and feels his questions and concerns are being addressed. He is glad to be here on rehab and be moving around more in therapies Premorbid pt/family roles/activities: Husband, father, employee, friend, co-worker, cyclist, etc Anticipated changes in roles/activities/participation: resume Pt/family expectations/goals: Pt states: " I want to be able to do as much as I can for myself I'm not one to ask for help."  Wife states: " I hope he does well but will assist if needed."  [employer]: None Premorbid Home Care/DME Agencies: None Transportation available at discharge: Wife will provide until he is healed  Discharge  Planning Living Arrangements: Spouse/significant other Support Systems: Spouse/significant other, Children, Friends/neighbors, Church/faith community Type of Residence: Private residence Insurance Resources: Media planner (specify) Administrator) Financial Resources: Employment, Garment/textile technologist Screen Referred: No Living Expenses: Banker Management: Patient, Spouse Does the patient have any problems obtaining your medications?: No Home Management: Both mostly wife Patient/Family Preliminary Plans: Return home with wife who plans to work from home for the first two weeks then will be there in the evenings. Pt is hopeful to be mod/i level before going home. He is aware may need assist at first. Care Coordinator Anticipated Follow Up Needs: HH/OP  Clinical Impression Pleasant gentleman who is doing well considering his injuries. He is glad to be on rehab and be moving around more, he is not one to sit still. His wife is involved and will assist when first goes home. Concern is there dog and not getting in the way of pt. Aware team conference Wed to discuss goals and target discharge date. Continue to work on discharge needs.  Lucy Chris 05/24/2020, 12:19 PM

## 2020-05-24 NOTE — Plan of Care (Signed)
  Problem: Consults Goal: RH GENERAL PATIENT EDUCATION Description: See Patient Education module for education specifics. Outcome: Progressing   Problem: RH SKIN INTEGRITY Goal: RH STG SKIN FREE OF INFECTION/BREAKDOWN Description: No skin breakdown this admission with mod I assist Outcome: Progressing Goal: RH STG MAINTAIN SKIN INTEGRITY WITH ASSISTANCE Description: STG Maintain Skin Integrity With mod I Assistance. Outcome: Progressing Goal: RH STG ABLE TO PERFORM INCISION/WOUND CARE W/ASSISTANCE Description: STG Able To Perform Incision/Wound Care With mod I Assistance. Outcome: Progressing   Problem: RH SAFETY Goal: RH STG ADHERE TO SAFETY PRECAUTIONS W/ASSISTANCE/DEVICE Description: STG Adhere to Safety Precautions With mod I Assistance/Device. Outcome: Progressing   Problem: RH PAIN MANAGEMENT Goal: RH STG PAIN MANAGED AT OR BELOW PT'S PAIN GOAL Description: Less than 3 out of 10 Outcome: Progressing   Problem: RH KNOWLEDGE DEFICIT GENERAL Goal: RH STG INCREASE KNOWLEDGE OF SELF CARE AFTER HOSPITALIZATION Description: Patient will be able to manage care independently using handouts provided by staff with mod I assist  Outcome: Progressing

## 2020-05-25 ENCOUNTER — Inpatient Hospital Stay (HOSPITAL_COMMUNITY): Payer: 59

## 2020-05-25 ENCOUNTER — Inpatient Hospital Stay (HOSPITAL_COMMUNITY): Payer: 59 | Admitting: Occupational Therapy

## 2020-05-25 DIAGNOSIS — G8918 Other acute postprocedural pain: Secondary | ICD-10-CM

## 2020-05-25 DIAGNOSIS — E871 Hypo-osmolality and hyponatremia: Secondary | ICD-10-CM

## 2020-05-25 DIAGNOSIS — R339 Retention of urine, unspecified: Secondary | ICD-10-CM

## 2020-05-25 DIAGNOSIS — K5903 Drug induced constipation: Secondary | ICD-10-CM

## 2020-05-25 MED ORDER — APIXABAN 2.5 MG PO TABS
2.5000 mg | ORAL_TABLET | Freq: Two times a day (BID) | ORAL | Status: DC
  Administered 2020-05-25 – 2020-06-01 (×14): 2.5 mg via ORAL
  Filled 2020-05-25 (×14): qty 1

## 2020-05-25 MED ORDER — BETHANECHOL CHLORIDE 10 MG PO TABS
10.0000 mg | ORAL_TABLET | Freq: Every day | ORAL | Status: DC
  Administered 2020-05-26: 10 mg via ORAL
  Filled 2020-05-25: qty 1

## 2020-05-25 NOTE — Plan of Care (Signed)
  Problem: Consults Goal: RH GENERAL PATIENT EDUCATION Description: See Patient Education module for education specifics. Outcome: Progressing   Problem: RH SKIN INTEGRITY Goal: RH STG SKIN FREE OF INFECTION/BREAKDOWN Description: No skin breakdown this admission with mod I assist Outcome: Progressing Goal: RH STG MAINTAIN SKIN INTEGRITY WITH ASSISTANCE Description: STG Maintain Skin Integrity With mod I Assistance. Outcome: Progressing Goal: RH STG ABLE TO PERFORM INCISION/WOUND CARE W/ASSISTANCE Description: STG Able To Perform Incision/Wound Care With mod I Assistance. Outcome: Progressing   Problem: RH SAFETY Goal: RH STG ADHERE TO SAFETY PRECAUTIONS W/ASSISTANCE/DEVICE Description: STG Adhere to Safety Precautions With mod I Assistance/Device. Outcome: Progressing   Problem: RH PAIN MANAGEMENT Goal: RH STG PAIN MANAGED AT OR BELOW PT'S PAIN GOAL Description: Less than 3 out of 10 Outcome: Progressing   Problem: RH KNOWLEDGE DEFICIT GENERAL Goal: RH STG INCREASE KNOWLEDGE OF SELF CARE AFTER HOSPITALIZATION Description: Patient will be able to manage care independently using handouts provided by staff with mod I assist  Outcome: Progressing   

## 2020-05-25 NOTE — Progress Notes (Signed)
Occupational Therapy Session Note  Patient Details  Name: Arthur Rios MRN: [id number] Date of Birth: 03-08-1956  Today's Date: 05/25/2020 OT Individual Time: 1194-1740 OT Individual Time Calculation (min): 73 min    Short Term Goals: Week 1:  OT Short Term Goal 1 (Week 1): STGs=LTGs due to ELOS  Skilled Therapeutic Interventions/Progress Updates:    Pt sitting up in w/c reporting feeling pretty sore on both left and right side today from previous therapy sessions. Nurse made aware.  Pt agreeable to OT session.  Focused on functional transfer training at 3 in 1 commode over toilet and then sinkside bathing and dressing using AE.  Pt requesting to attempt standing urination at toilet however RW height was a barrier and pt unable to utilize one grab bar to steady and complete toileting therefore instructed pt through SPT w/c<>3 in 1 commode with CGA.  Pt had continent episode of urine.  Toileting completed with min assist.  Pt ambulated out of bathroom to w/c with CGA.  UB bathing and dressing at sink completed with setup.  LB bathing and dressing using long handled sponge and reacher completed with CGA for standing portion and min assist for buttocks and pulling pants over hips on right side.  Pt requesting back to bed.  CGA SPT at RW w/c to EOB and min assist to support RLE sit to supine.  Call bell in reach, bed alarm on.  Therapy Documentation Precautions:  Precautions Precautions: Fall, Posterior Hip Precaution Comments: right 5th - 10th rib fxs Restrictions Weight Bearing Restrictions: Yes RUE Weight Bearing: Weight bearing as tolerated RLE Weight Bearing: Touchdown weight bearing Other Position/Activity Restrictions: Pt likes to maintain NWB RLE.   Therapy/Group: Individual Therapy  Amie Critchley 05/25/2020, 9:29 AM

## 2020-05-25 NOTE — Progress Notes (Signed)
Physical Therapy Session Note  Patient Details  Name: Arthur Rios MRN: [id number] Date of Birth: 1955-09-20  Today's Date: 05/25/2020 PT Individual Time: 1100-1200 + 1455-1525 PT Individual Time Calculation (min): 60 min + 30 min  Short Term Goals: Week 1:  PT Short Term Goal 1 (Week 1): STG=LTG due to LOS.  Skilled Therapeutic Interventions/Progress Updates:     1st session: Pt received sitting in w/c, awake and motivated to participate in therapy session. Pt reports moderate fatigue from prior PT session, denies pain. Pt now with thigh-high TED hose which were donned on arrival. WC transport with totalA for time management from his room to main therapy gym. Wheeled facing // bars, performed sit<>stand with minA to // bars and performed the following there-ex: -1x10 standing heel raises on LLE -1x10 standing hip abduction RLE without ankle weight -1x10 standing hip abduction RLE with 1.5# ankle weight -1x10 standing hip marches (<90 deg) RLE without ankle weight -1x10 standing hip marches (<90 deg) RLE with 1.5# ankle weight -1x10 standing hamstring curls (<90 deg) RLE without ankle weight -1x10 standing hamstring curls (<90 deg) RLE with 1.5# ankle weight -1x10 standing SLR RLE without ankle weight -1x10 standing SLR RLE with 1.5# ankle weight -1x10 standing hip extension RLE without ankle weight -1x10 standing hip extension RLE with 1.5# ankle weight  Frequent rest breaks provided during above there-ex for both weakness and slight lightheadedness. Pt then performed interval gait training, ambulating a total of 13ft + 54ft + 63ft with CGA and RW, demo's hop-to gait pattern while maintaining NWB RLE status (although TDWB, he prefers to NWB). Gait distances limited primarily by lightheadedness > weakness. Pt ended session seated in recliner with needs in reach, made comfortable.  2nd session: Pt received supine in bed, resting and agreeable to PT session. Pt reports moderate  fatigue from busy day of therapies. Endorses 1/10 L groin pain. Performed the following supine exercises: -1x10 AROM LLE: heel slides, SLR, hip abduction, SAQ with pillow bolster -1x10 AAROM RLE: heel slides, SLR, hip abduction, SAQ with pillow bolster  Donned TED hose with totalA while supine for time management. Supine<>sit with minA for RLE management. Performed repeated 2x5 sit<>stands to RW with CGA. After seated rest, completed x30 seconds of static hopping on LLE with RW support, completing x10 hops with decreased foot clearance. Returned supine with minA for RLE management, supine scoot with minA. Pt made comfortable, 3/4 rails up, doffed TED hose, HOB elevated, needs in reach.    Therapy Documentation Precautions:  Precautions Precautions: Fall, Posterior Hip Precaution Comments: right 5th - 10th rib fxs Restrictions Weight Bearing Restrictions: Yes RUE Weight Bearing: Weight bearing as tolerated RLE Weight Bearing: Touchdown weight bearing Other Position/Activity Restrictions: Pt likes to maintain NWB RLE.  Therapy/Group: Individual Therapy  Mai Longnecker P Nixon Sparr PT 05/25/2020, 7:53 AM
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
## 2020-05-25 NOTE — Progress Notes (Signed)
Physical Therapy Session Note  Patient Details  Name: Arthur Rios MRN: [id number] Date of Birth: [birth year]  Today's Date: 05/25/2020 PT Individual Time: 0800-0858 PT Individual Time Calculation (min): 58 min   Short Term Goals: Week 1:  PT Short Term Goal 1 (Week 1): STG=LTG due to LOS.  Skilled Therapeutic Interventions/Progress Updates:    Patient received supine in bed agreeable to PT, wife at bedside. He reports 3/10 pain in R hip (premedicated), but declines further pain interventions at this time. Patient able to transfer to wc via stand pivot with FWW and SBA. PT propelled patient to gym for time management. BP in supine in bed: 138/76. BP seated in wc: 128/89. Patient has TED hose on. He was able to ambulate 2x44ft with FWW and CGA. He maintained NWB status to R LE for comfort. After returning to wc, patient reported feeling "woozy." BP taken: 87/64. Patient completing seated LE therex to assist with returning to higher BP: LAQ, seated marches and HSC 2x12 with 4# ankle weight. BP taken afterward: 94/74. He stated he was feeling better and would like to try step ups onto 4" box. He was able to do so with FWW + CGA x8. BP upon returning to room: 114/77. Patient remaining in wc, seatbelt alarm on, call light within reach.   Therapy Documentation Precautions:  Precautions Precautions: Fall, Posterior Hip Precaution Comments: right 5th - 10th rib fxs Restrictions Weight Bearing Restrictions: Yes RUE Weight Bearing: Weight bearing as tolerated RLE Weight Bearing: Touchdown weight bearing Other Position/Activity Restrictions: Pt likes to maintain NWB RLE.    Therapy/Group: Individual Therapy  Elizebeth Koller, PT, DPT, CBIS 05/25/2020, 7:37 AM

## 2020-05-26 ENCOUNTER — Inpatient Hospital Stay (HOSPITAL_COMMUNITY): Payer: 59 | Admitting: Occupational Therapy

## 2020-05-26 ENCOUNTER — Inpatient Hospital Stay (HOSPITAL_COMMUNITY): Payer: 59

## 2020-05-26 DIAGNOSIS — D62 Acute posthemorrhagic anemia: Secondary | ICD-10-CM

## 2020-05-26 MED ORDER — SENNOSIDES-DOCUSATE SODIUM 8.6-50 MG PO TABS: 1.0000 | ORAL_TABLET | Freq: Every evening | ORAL | Status: DC | PRN

## 2020-05-26 NOTE — Progress Notes (Signed)
Occupational Therapy Session Note  Patient Details  Name: Arthur Rios MRN: [id number] Date of Birth: 20-Nov-1955  Today's Date: 05/26/2020 OT Individual Time: 1000-1100 OT Individual Time Calculation (min): 60 min    Short Term Goals: Week 1:  OT Short Term Goal 1 (Week 1): STGs=LTGs due to ELOS  Skilled Therapeutic Interventions/Progress Updates:    Pt semi fowler in bed, c/o mild light headedness, requesting shower level bathing.  Min assist for RLE support supine to sit with HOB elevated slightly. BP assessed 128/70 mmHg sitting EOB. Pt ambulated to bathroom and completed stand to sit using RW at TTB with CGA. Pt doffed pants needing CGA and VCs to doff over feet in sitting versus standing to increase safety. Pt dressed and bathed UB with setup. Pt bathed LB seated with careful weight shifting to wash buttocks.  Donned boxers and pants using reacher with CGA at RW for standing to pull over hips.  Pt ambulated to w/c at bedside using RW with CGA.  BP assessed at end of session 108/26mmHg.  Pt requesting to let BLE dry fully before redonning TED Hose.  Instructed pt to request assist from nursing to re-donn if light headedness occurs.  Call bell in reach, seat alarm on.  Therapy Documentation Precautions:  Precautions Precautions: Fall, Posterior Hip Precaution Comments: right 5th - 10th rib fxs Restrictions Weight Bearing Restrictions: (P) Yes RUE Weight Bearing: (P) Weight bearing as tolerated RLE Weight Bearing: (P) Partial weight bearing Other Position/Activity Restrictions: Pt likes to maintain NWB RLE.   Therapy/Group: Individual Therapy  Amie Critchley 05/26/2020, 10:00 AM

## 2020-05-26 NOTE — Progress Notes (Signed)
Patient ID: Arthur Rios, male   DOB: [birth year], 64 y.o.   MRN: [id number]  Met with pt to discus team conference goals -mod/i-supervision level and target discharge date 9/29. He felt his would be the date and feels he will be ready for it. His blood pressure is what is limiting him in therapies and he and MD are working on this. Discussed OP follow up he went the the OP at [facility] and would like to go back since so close to his home. Will look into this and set up OP if able to accept referral. Pt will let his wife know and if any questions will call this worker.

## 2020-05-26 NOTE — Plan of Care (Signed)
  Problem: RH SKIN INTEGRITY Goal: RH STG SKIN FREE OF INFECTION/BREAKDOWN Description: No skin breakdown this admission with mod I assist Outcome: Progressing Goal: RH STG MAINTAIN SKIN INTEGRITY WITH ASSISTANCE Description: STG Maintain Skin Integrity With mod I Assistance. Outcome: Progressing Goal: RH STG ABLE TO PERFORM INCISION/WOUND CARE W/ASSISTANCE Description: STG Able To Perform Incision/Wound Care With mod I Assistance. Outcome: Progressing   Problem: RH SAFETY Goal: RH STG ADHERE TO SAFETY PRECAUTIONS W/ASSISTANCE/DEVICE Description: STG Adhere to Safety Precautions With mod I Assistance/Device. Outcome: Progressing

## 2020-05-26 NOTE — Progress Notes (Signed)
Physical Therapy Session Note  Patient Details  Name: ISAYAH IGNASIAK MRN: [id number] Date of Birth: 11/01/1955  Today's Date: 05/26/2020 PT Individual Time: 1400-1500 PT Individual Time Calculation (min): 60 min   Short Term Goals: Week 1:  PT Short Term Goal 1 (Week 1): STG=LTG due to LOS.  Skilled Therapeutic Interventions/Progress Updates:    Pt received sitting in w/c, awake and agreeable to PT session. Reports generalized unrated R hip and R clavicle pain, rest breaks and mobility provided for pain management. Donned B TED's with totalA for time management. Wheeled himself with supervision from his room to main therapy gym ~184ft for purposes of general strengthening. Attempted trial of gait with axillary crutches however after 1st hop he had large LOB posteriorly requiring mod/maxA for recovery. Seated rest and deferred crutches until later date if coordination and standing balance improves. Performed interval gait training with RW, ambulating variable distances with CGA and RW as follows: 59ft + 57ft + 25ft + 131ft. Extended seated rest breaks required b/w trials. Therapist cueing for heel-to-toe progressions with LLE, guided breathing (tends to hold his breath), RW management, upright posture, and shoulder depression. Pt with a few minor LOB's that were self corrected. He denies lightheadedness with gait and felt that distances were limited primarily by fatigue. Pt appearing happy with results of today's session. Stand>sit to EOB with RW and supervision, sit>supine with CGA/minA for RLE management. Ended session supine in bed with HOB elevated, needs in reach, bed alarm on.  Therapy Documentation Precautions:  Precautions Precautions: Fall, Posterior Hip Precaution Comments: right 5th - 10th rib fxs Restrictions Weight Bearing Restrictions: Yes RUE Weight Bearing: Weight bearing as tolerated RLE Weight Bearing: Touchdown weight bearing Other Position/Activity Restrictions: Pt likes to  maintain NWB RLE.  Therapy/Group: Individual Therapy  Aaniya Sterba P Louine Tenpenny PT 05/26/2020, 3:45 PM
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
## 2020-05-26 NOTE — Progress Notes (Signed)
Physical Therapy Session Note  Patient Details  Name: Arthur Rios MRN: [id number] Date of Birth: Sep 29, 1955  Today's Date: 05/26/2020 PT Individual Time: 0800-0916 PT Individual Time Calculation (min): 76 min   Short Term Goals: Week 1:  PT Short Term Goal 1 (Week 1): STG=LTG due to LOS.  Skilled Therapeutic Interventions/Progress Updates:    Patient received sitting on BSC agreeable to PT assist and PT session. He reports 2/10 pain in R hip and ribs, premedicated. Extended rest breaks given throughout therapy session to help minimize pain. Supervision provided for lower body dressing in standing by BSC. Supervision for stand pivot transfer to edge of bed with FWW. PT donned TED hose with patient sitting EOB. Supervision transfer to wc via stand pivot with FWW. Patient able to propel himself in wc to therapy gym >121ft. BP in sitting: 132/83. BP after standing 2 mins: 115/70 with reports of "feeling woozy." Extended rest break needed for patient to recover from this. BP after standing 1 min: 102/74 with reports of "feeling woozy" again. After rest break, patient able to stand and complete pre-gait swinging with R LE, unressisted. Patient educated on fx sites and location of various hardware pieces. Patient agreeable to attempting [facility] to transition to more natural gait pattern and improved safety with stair negotiation.  He was able to ambulate 85ft with AC + MinA. He did report feeling less stable compared to [facility] and reported feeling light headed at end of gait bout. BP upon sitting: 99/70. Patient requiring up to 5 mins to recover. He was agreeable to seated LE therex with 4# ankle weight: LAQ 2x12, marches 2x12. Patient returning to bed, bed alarm on, call light within reach.   Therapy Documentation Precautions:  Precautions Precautions: Fall, Posterior Hip Precaution Comments: right 5th - 10th rib fxs Restrictions Weight Bearing Restrictions: Yes RUE Weight Bearing: Weight bearing as  tolerated RLE Weight Bearing: Touchdown weight bearing Other Position/Activity Restrictions: Pt likes to maintain NWB RLE.   Therapy/Group: Individual Therapy  Elizebeth Koller, PT, DPT, CBIS 05/26/2020, 8:46 AM
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
## 2020-05-27 ENCOUNTER — Inpatient Hospital Stay (HOSPITAL_COMMUNITY): Payer: 59

## 2020-05-27 ENCOUNTER — Inpatient Hospital Stay (HOSPITAL_COMMUNITY): Payer: 59 | Admitting: Occupational Therapy

## 2020-05-27 LAB — BASIC METABOLIC PANEL
Anion gap: 11 (ref 5–15)
BUN: 14 mg/dL (ref 8–23)
CO2: 26 mmol/L (ref 22–32)
Calcium: 9.2 mg/dL (ref 8.9–10.3)
Chloride: 98 mmol/L (ref 98–111)
Creatinine, Ser: 0.89 mg/dL (ref 0.61–1.24)
GFR calc Af Amer: >60 mL/min (ref >60)
GFR calc non Af Amer: >60 mL/min (ref >60)
Glucose, Bld: 100 mg/dL — ABNORMAL HIGH (ref 70–99)
Potassium: 4.3 mmol/L (ref 3.5–5.1)
Sodium: 135 mmol/L (ref 135–145)

## 2020-05-27 MED ORDER — TRAMADOL HCL 50 MG PO TABS
50.0000 mg | ORAL_TABLET | ORAL | Status: DC | PRN
  Administered 2020-05-27 – 2020-06-01 (×12): 50 mg via ORAL
  Filled 2020-05-27 (×12): qty 1
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
## 2020-05-27 NOTE — Progress Notes (Signed)
Physical Therapy Session Note  Patient Details  Name: Arthur Rios MRN: [id number] Date of Birth: [birth year]  Today's Date: 05/27/2020 PT Individual Time: 1415-1525 PT Individual Time Calculation (min): 70 min   Short Term Goals: Week 1:  PT Short Term Goal 1 (Week 1): STG=LTG due to LOS.  Skilled Therapeutic Interventions/Progress Updates:    Pt received sitting in w/c, agreeable to PT session, denies pain on arrival. Wheeled himself in w/c with supervision from his room to main therapy gym. Performed 3x4 reps of hopping up/down 4inch curb with CGA/minA and RW. Therapist providing demonstration prior for safe technique and assisting with blocking RW for safety. Pt required seated rest breaks b/w trials 2/2 fatigue. Pt then navigated up/down 2x8 3inch steps with RW and minA, hop-to pattern. Therapist blocking RW and cues for hand placement more forward on RW to create force into step angles. Pt fearful of falling during this but confidence appeared to improve with 2nd trial. Reinforced importance of having 2nd person assist with stairs at home and will incorporate family training with wife soon to practice stairs. Pt then performed standing there-ex with 1.5# ankle weight while facing // bars: -2x10 RLE hip abduction -2x10 RLE hamstring curls -2x10 RLE SLR hip flexion  WC transport back to his room for time management, stand<>pivot with supervision and RW, stand>sit with CGA and RW, and sit>supine with supervision although increased effort for RLE management. Pt ended session supine in bed, needs in reach, bed alarm on.   Therapy Documentation Precautions:  Precautions Precautions: Fall, Posterior Hip Precaution Comments: right 5th - 10th rib fxs Restrictions Weight Bearing Restrictions: Yes RUE Weight Bearing: Weight bearing as tolerated RLE Weight Bearing: Touchdown weight bearing Other Position/Activity Restrictions: Pt likes to maintain NWB RLE.  Therapy/Group: Individual  Therapy  Lollie Gunner P Kayliana Codd PT 05/27/2020, 3:26 PM

## 2020-05-27 NOTE — Progress Notes (Signed)
Physical Therapy Session Note  Patient Details  Name: Arthur Rios MRN: [id number] Date of Birth: September 06, 1955  Today's Date: 05/27/2020 PT Individual Time: 0915-1028 PT Individual Time Calculation (min): 73 min   Short Term Goals: Week 1:  PT Short Term Goal 1 (Week 1): STG=LTG due to LOS.  Skilled Therapeutic Interventions/Progress Updates:    Patient received sitting up in bed agreeable to PT. He denies pain at this time. He is able to come to sitting edge of bed with stand by assist and verbal cues. Patient donning pants with supervision. PT applied TED hose for BP assist. He was able to ambulate from bed to wc with FWW (62ft) with supervision. Patient propelling himself to therapy gym. BP sitting: 132/89. BP standing with FWW: 123/87. He was able to ambulate 289ft with FWW and supervision. BP after ambulating: 132/76. He reports feeling slightly light headed, but relates this to feeling short of breath and not a drop in BP. Patient agreeable to attempting gait with AC. He was able ambulate 74ft before fatiguing and required a sitting rest break. BP after AC: 132/76. Patient requesting to go outside. He was able to propel wc >427ft to go outside. Ambulating 2x139ft with FWW and supervision on uneven surfaces. Patient completing seated therex with 4# ankle weight: LAQ, marches 2x12. Patient requesting to go to restroom. He was able to ambulate into bathroom with FWW and supervision. RN and NT aware of patients location. Call string within reach.   Therapy Documentation Precautions:  Precautions Precautions: Fall, Posterior Hip Precaution Comments: right 5th - 10th rib fxs Restrictions Weight Bearing Restrictions: Yes RUE Weight Bearing: Weight bearing as tolerated RLE Weight Bearing: Touchdown weight bearing Other Position/Activity Restrictions: Pt likes to maintain NWB RLE.    Therapy/Group: Individual Therapy  Elizebeth Koller, PT, DPT, CBIS 05/27/2020, 7:43 AM

## 2020-05-27 NOTE — Progress Notes (Signed)
Occupational Therapy Session Note  Patient Details  Name: Arthur Rios MRN: [id number] Date of Birth: [birth year]  Today's Date: 05/27/2020 OT Individual Time: 1103-1205 OT Individual Time Calculation (min): 62 min    Short Term Goals: Week 1:  OT Short Term Goal 1 (Week 1): STGs=LTGs due to ELOS  Skilled Therapeutic Interventions/Progress Updates:    Pt sitting up in w/c reporting he feels much better today and less light headed.  Pt only complaints of pain throughout session was mild soreness in right ribcage; rest and repositioned as needed to address pain.  OT collaborated with pt regarding recently taken measurements by wife, of various doorways in pts home.  Pt requesting to bathe at sinkside.  Setup for oral hygiene, UB dressing, and UB bathing in seated position.  Pt bathed LB not including buttocks or lower legs with setup.  Pt donned brief and shorts using reacher with supervision.  Elastic shoe lace applied to left sneaker and pt donned with mod I.  Pt then participated in transfer training using RW over 3 inch threshold to simulate home walk in shower setup.  Discussed TTB versus chair and most likely will need chair due to shower doorway width.  Pt completed threshold forward step over transfer x 2 with CGA. Call bell in reach, seat alarm on.  Therapy Documentation Precautions:  Precautions Precautions: Fall, Posterior Hip Precaution Comments: right 5th - 10th rib fxs Restrictions Weight Bearing Restrictions: Yes RUE Weight Bearing: Weight bearing as tolerated RLE Weight Bearing: Touchdown weight bearing Other Position/Activity Restrictions: Pt likes to maintain NWB RLE.   Therapy/Group: Individual Therapy  Amie Critchley 05/27/2020, 12:53 PM

## 2020-05-28 ENCOUNTER — Inpatient Hospital Stay (HOSPITAL_COMMUNITY): Payer: 59 | Admitting: Occupational Therapy

## 2020-05-28 ENCOUNTER — Inpatient Hospital Stay (HOSPITAL_COMMUNITY): Payer: 59

## 2020-05-28 ENCOUNTER — Inpatient Hospital Stay (HOSPITAL_COMMUNITY): Payer: 59 | Admitting: Physical Therapy

## 2020-05-28 NOTE — Progress Notes (Signed)
Occupational Therapy Session Note  Patient Details  Name: Arthur Rios MRN: [id number] Date of Birth: Aug 09, 1956  Today's Date: 05/28/2020 OT Individual Time: 1415-1530 OT Individual Time Calculation (min): 75 min   Short Term Goals: Week 1:  OT Short Term Goal 1 (Week 1): STGs=LTGs due to ELOS  Skilled Therapeutic Interventions/Progress Updates:    Pt greeted in the w/c, RN in at start of session to provide pain medicine. He had multiple questions regarding d/c planning. Started with brainstorming shower setup/DME with pt reporting he already completed a simulated walk-in shower transfer in previous OT session. He wasn't sure if he wanted a TTB or shower chair for home, provided OT with his shower stall dimensions from home and then OT measured the TTB. Pt able to accommodate a TTB in home shower, spouse [name] ordered TTB during session (along with LH shoe horn, 6 inch step + traction tape, and sock aide). Transitioned to bed mobility with bed elevated to height at home. Pt does not want to remove boxspring mattresses. Worked on bed transfers using 3 steps of varying heights, pt reporting the most ease with the 6 inch step. He did well with maintaining Rt hip precautions when transitioning to EOB<supine<EOB again. Close supervision for ambulation using RW with hop to gait pattern at this time. Next brainstormed home access with pt reporting that he has 3 steps to enter home and he will not have rails. These stairs are ~12 inches deep and 7 inches high. We practiced posterior approach stair negotiation (~10 inches deep, 6 inches high) with 2nd person stabilizing RW, first OT stabilizing and then spouse [name] had hands on practice with this after OT demonstrated. Pt then self propelled back to room and completed a short distance ambulatory transfer back to bed using RW with close supervision for balance. Left pt in bed with all needs within reach and bed alarm set.    Therapy  Documentation Precautions:  Precautions Precautions: Fall, Posterior Hip Precaution Comments: right 5th - 10th rib fxs Restrictions Weight Bearing Restrictions: Yes RUE Weight Bearing: Weight bearing as tolerated RLE Weight Bearing: Touchdown weight bearing Other Position/Activity Restrictions: Pt likes to maintain NWB RLE. Vital Signs: Therapy Vitals Temp: 97.9 F (36.6 C) Pulse Rate: (!) 56 Resp: 18 BP: 125/71 Patient Position (if appropriate): Sitting Oxygen Therapy SpO2: 100 % O2 Device: Room Air ADL: ADL Eating: Independent Grooming: Setup Where Assessed-Grooming: Other (comment) (sitting on BSC) Upper Body Bathing: Setup Where Assessed-Upper Body Bathing: Other (Comment) (sitting on BSC) Lower Body Bathing: Maximal assistance Where Assessed-Lower Body Bathing: Other (Comment) (sit<stand from BSC using RW) Upper Body Dressing: Setup Where Assessed-Upper Body Dressing: Other (Comment) (sitting on BSC) Lower Body Dressing: Maximal assistance Where Assessed-Lower Body Dressing: Other (Comment) (sit<stand from BSC using RW) Toileting: Maximal assistance Where Assessed-Toileting: Bedside Commode Toilet Transfer: Furniture conservator/restorer Method: Stand pivot (RW) Acupuncturist: Animator Transfer: Not assessed      Therapy/Group: Individual Therapy  Lusero Nordlund A Bethany Cumming 05/28/2020, 4:12 PM

## 2020-05-28 NOTE — Progress Notes (Signed)
Physical Therapy Session Note  Patient Details  Name: Arthur Rios MRN: [id number] Date of Birth: [birth year]  Today's Date: 05/28/2020 PT Individual Time: 1000-1100 PT Individual Time Calculation (min): 60 min   Short Term Goals: Week 1:  PT Short Term Goal 1 (Week 1): STG=LTG due to LOS.  Skilled Therapeutic Interventions/Progress Updates:    Pt received sitting in w/c, agreeable to PT session, denies pain. Spoke at length regarding home setup and DC planning. Reports garage entrance has 2 STE with 7inch height. Front door with 3 STE with 4.5inch + 7inch heights. Bed height 32inch. WC transport with totalA from his room to main therapy gym. Doffed B TED's to assess BP with vs. Without. See BP readings below. Pt asymptomatic of orthostasis throughout session. Performed interval gait training, ambulated a total of 144ft + 177ft + 67ft with CGA and RW. Performed car transfer with minA (for RLE management) and RW. Car height was set to 29inches to simulate his wife's BMW SUV. Performed neuro-red on blue airex, performing multiple bouts of SLS on LLE with vs without RW support. Then performed SLS on level surface while raising ball above head to promote R shldr ROM (to tolerance) and standing balance. Pt reporting moderate L hip fatigue at end of session from SLS activities. WC transport back to his room where he remained seated in w/c with needs in reach. He was pleased with progress and ability to manage without TED's during session.  Standing with TED's: 126/87 (99) HR 67 Standing without TED's: 137/81 (98) HR 62 Standing without TED's at end of session: 121/82 (94) HR 73  Therapy Documentation Precautions:  Precautions Precautions: Fall, Posterior Hip Precaution Comments: right 5th - 10th rib fxs Restrictions Weight Bearing Restrictions: Yes RUE Weight Bearing: Weight bearing as tolerated RLE Weight Bearing: Touchdown weight bearing Other Position/Activity Restrictions: Pt likes to  maintain NWB RLE.  Therapy/Group: Individual Therapy  [name] P Yaretsi Humphres PT 05/28/2020, 12:14 PM
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
## 2020-05-28 NOTE — Progress Notes (Signed)
Physical Therapy Session Note  Patient Details  Name: Arthur Rios MRN: [id number] Date of Birth: 1956-07-01  Today's Date: 05/28/2020 PT Individual Time: 0807-0901 PT Individual Time Calculation (min): 54 min   Short Term Goals: Week 1:  PT Short Term Goal 1 (Week 1): STG=LTG due to LOS.  Skilled Therapeutic Interventions/Progress Updates:  Pt received supine in bed and agreeable to therapy session. Therapist donned thigh high TED hose total assist. Supine>sitting L EOB with supervision. Sitting EOB donned shorts and L shoe total assist for time management. Sit<>stands using RW with close supervision for safety during session - pt excellent at maintaining R LE WBing precautions often preferring NWBing. R stand pivot to w/c using RW with close supervision for safety.  Transported to/from gym in w/c for time management and energy conservation. Gait training ~127ft x2 using RW swapped from bariatric to standard RW - all with close supervision for safety - pt reports standard RW moves faster - pt maintains R LE NWBing during this task with intermittent TDWB using hop-to pattern with L LE. Ascended/descended 8 (3" height) steps x2 using bariatric RW then standard RW with therapist assisting primarily for stabilizing RW and intermittent CGA for safety/steadying. Discussed D/C planning with pt reporting he has hard floor in main portion of house and carpet in bedrooms - would benefit from gait training in ADL aparment. Pt also doesn't want to need TEDs at home - discussed with Elita Quick, PA and Dr. Carlis Abbott about trying therapy session without them and PA/MD in agreement. At end of session transported back to room and left seated in w/c with needs in reach.   Therapy Documentation Precautions:  Precautions Precautions: Fall, Posterior Hip Precaution Comments: right 5th - 10th rib fxs Restrictions Weight Bearing Restrictions: Yes RUE Weight Bearing: Weight bearing as tolerated RLE Weight Bearing:  Touchdown weight bearing Other Position/Activity Restrictions: Pt likes to maintain NWB RLE.  Pain: Reports R shoulder pain states "I can tell I'm working it" and "just more achy stuff"  R posterior knee "shooting" pain, which pt associates with having performed exercises yesterday - Dr. Carlis Abbott made aware.   Therapy/Group: Individual Therapy  Ginny Forth , PT, DPT, CSRS  05/28/2020, 7:49 AM

## 2020-05-29 ENCOUNTER — Inpatient Hospital Stay (HOSPITAL_COMMUNITY): Payer: 59

## 2020-05-29 DIAGNOSIS — S32491S Other specified fracture of right acetabulum, sequela: Secondary | ICD-10-CM
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
## 2020-05-30 ENCOUNTER — Encounter (HOSPITAL_COMMUNITY): Payer: 59 | Admitting: Occupational Therapy

## 2020-05-30 ENCOUNTER — Ambulatory Visit (HOSPITAL_COMMUNITY): Payer: 59 | Admitting: Physical Therapy
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
## 2020-05-31 ENCOUNTER — Inpatient Hospital Stay (HOSPITAL_COMMUNITY): Payer: 59

## 2020-05-31 ENCOUNTER — Inpatient Hospital Stay (HOSPITAL_COMMUNITY): Payer: 59 | Admitting: Occupational Therapy

## 2020-05-31 DIAGNOSIS — S32491D Other specified fracture of right acetabulum, subsequent encounter for fracture with routine healing: Secondary | ICD-10-CM

## 2020-05-31 DIAGNOSIS — Z741 Need for assistance with personal care: Secondary | ICD-10-CM

## 2020-05-31 DIAGNOSIS — S2249XD Multiple fractures of ribs, unspecified side, subsequent encounter for fracture with routine healing: Secondary | ICD-10-CM

## 2020-05-31 DIAGNOSIS — Z7409 Other reduced mobility: Secondary | ICD-10-CM

## 2020-05-31 DIAGNOSIS — I951 Orthostatic hypotension: Secondary | ICD-10-CM

## 2020-05-31 DIAGNOSIS — S42001D Fracture of unspecified part of right clavicle, subsequent encounter for fracture with routine healing: Secondary | ICD-10-CM

## 2020-05-31 MED ORDER — ACETAMINOPHEN 325 MG PO TABS: 650.0000 mg | ORAL_TABLET | Freq: Three times a day (TID) | ORAL | Status: DC

## 2020-05-31 MED ORDER — VITAMIN D3 25 MCG PO TABS: 2000.0000 [IU] | ORAL_TABLET | Freq: Two times a day (BID) | ORAL | 0 refills | Status: DC

## 2020-05-31 MED ORDER — METHOCARBAMOL 500 MG PO TABS: 500.0000 mg | ORAL_TABLET | Freq: Four times a day (QID) | ORAL | 0 refills | Status: DC | PRN

## 2020-05-31 MED ORDER — OMEPRAZOLE 20 MG PO CPDR: 20.0000 mg | DELAYED_RELEASE_CAPSULE | Freq: Every day | ORAL | 0 refills | Status: AC

## 2020-05-31 MED ORDER — ASCORBIC ACID 1000 MG PO TABS: 1000.0000 mg | ORAL_TABLET | Freq: Every day | ORAL | 0 refills | Status: DC

## 2020-05-31 MED ORDER — OXYCODONE HCL 5 MG PO TABS: 5.0000 mg | ORAL_TABLET | ORAL | 0 refills | Status: DC | PRN

## 2020-05-31 MED ORDER — APIXABAN 2.5 MG PO TABS: 2.5000 mg | ORAL_TABLET | Freq: Two times a day (BID) | ORAL | 0 refills | Status: DC

## 2020-05-31 MED ORDER — GABAPENTIN 300 MG PO CAPS: 300.0000 mg | ORAL_CAPSULE | Freq: Three times a day (TID) | ORAL | 0 refills | Status: DC

## 2020-05-31 NOTE — Discharge Summary (Addendum)
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 patient was discharged home       Disposition: Discharged home    Diet: Regular  Special Instructions: No driving smoking or alcohol  Nonweightbearing right lower extremity.  Weightbearing as tolerated right upper extremity  Continue to hold his Zestoretic until follow-up with PCP.  Medications at discharge 1.  Tylenol as needed 2.  Eliquis 2.5 mg p.o. twice daily until 10/9/ 2021 3.  Vitamin C 1000 mg p.o. daily 4.  Vitamin D 2000 units p.o. twice daily 5.  Neurontin 300 mg p.o. 3 times daily 6.  Robaxin 500 mg every 6 hours as needed muscle spasms 7.  Multivitamin daily 8.  Oxycodone 5 to 10 mg every 4 hours as needed pain 9.  Prilosec 20 mg daily 10.  Provigil 200 mg daily  30-35 minutes were spent completing discharge summary and discharge planning      Follow-up Information     Marcello Fennel, MD Follow up.   Specialty: Physical Medicine and Rehabilitation Why: As directed Contact information: [address] [place] [address] [phone]         Myrene Galas, MD Follow up.   Specialty: Orthopedic Surgery Why: Call for appointment Contact information: [address] [place]  [address] [phone]                 Signed: Charlton Amor 06/01/2020, 5:28 AM Patient was seen, face-face, and physical exam performed by me on day of discharge, greater than 30 minutes of total time spent.. Please see progress note from day of discharge as well.  Maryla Morrow, MD, ABPMR
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
## 2020-05-31 NOTE — Progress Notes (Addendum)
Patient ID: Arthur Rios, male   DOB: 12-31-1955, 64 y.o.   MRN: [id number]  Met with pt and team who would like for discharge to be tomorrow. Awaiting MD input, have ordered rolling walker, pt to get bedside commode on his own. OPPT set up via [facility] OP.   1:04 PM MD fine with discharge tomorrow, will let pt and team along with PA know. Pt does need a 3 in 1, have added to order for rolling walker
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
## 2020-06-01 NOTE — Progress Notes (Signed)
Patient discharged home with family, all belongings sent with patient. No complications noted at this time. Jacqui Headen D Yardley Beltran, LPN 
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
## 2020-06-01 NOTE — Progress Notes (Signed)
Inpatient Rehabilitation Care Coordinator  Discharge Note  The overall goal for the admission was met for:   Discharge location: [facility] 2 WEEKS   Length of Stay: Yes-10 DAYS  Discharge activity level: Yes-SUPERVISION-MOD/I LEVEL  Home/community participation: Yes  Services provided included: MD, RD, PT, OT, RN, CM, Pharmacy and SW  Financial Services: Private Insurance: [employer]  Follow-up services arranged: Outpatient: [facility]-HAVE SET UP APPOINTMENT WITH HIS WIFE, DME: [facility]-3 IN 1 AND RW and Patient/Family has no preference for HH/DME agencies  Comments (or additional information):PT DID WELL AND REQUESTED TO GO HOME ONE DAY EARLY AND READY TO GO. WIFE WILL BE WITH FOR THE FIRST TWO WEEKS WORKING FROM HOME  Patient/Family verbalized understanding of follow-up arrangements: Yes  Individual responsible for coordination of the follow-up plan: SELF [phone]  Confirmed correct DME delivered: Elease Hashimoto 06/01/2020    Kaiven Vester, Gardiner Rhyme

## 2020-06-01 NOTE — Discharge Instructions (Signed)
Inpatient Rehab Discharge Instructions  Arthur Rios Discharge date and time: No discharge date for patient encounter.   Activities/Precautions/ Functional Status: Activity: Nonweightbearing right lower extremity Diet: regular diet Wound Care: Routine skin checks Functional status:  ___ No restrictions     ___ Walk up steps independently ___ 24/7 supervision/assistance   ___ Walk up steps with assistance ___ Intermittent supervision/assistance  ___ Bathe/dress independently ___ Walk with walker     _x__ Bathe/dress with assistance ___ Walk Independently    ___ Shower independently ___ Walk with assistance    ___ Shower with assistance ___ No alcohol     ___ Return to work/school ________  Special Instructions: No driving smoking or alcohol  Continue Eliquis through 06/12/2020 and stop    COMMUNITY REFERRALS UPON DISCHARGE:    Outpatient: PT             Agency:[facility]  [phone]             Appointment Date/Time:WILL CALL YOU OR KELLY TO SET UP APPOINTMENT  Medical Equipment/Items Ordered:ROLLING WALKER & 3 IN 1                                                 Agency/Supplier: [facility]  [phone]  My questions have been answered and I understand these instructions. I will adhere to these goals and the provided educational materials after my discharge from the hospital.  Patient/Caregiver Signature _______________________________ Date __________  Clinician Signature _______________________________________ Date __________  Please bring this form and your medication list with you to all your follow-up doctor's appointments.

## 2020-06-02 ENCOUNTER — Telehealth: Payer: Self-pay | Admitting: *Deleted

## 2020-06-02 NOTE — Telephone Encounter (Signed)
Mr Arthur Rios called about not being able to get his eliquis--needing a prior auth and the card he was given was a coupon card and would not work if PA needed.  This has been resolved by Jesusita Oka changing card to a free 30 day card.    Transitional Care call-I spoke with Mr Arthur Rios    1. Are you/is patient experiencing any problems since coming home? Are there any questions regarding any aspect of care?No 2. Are there any questions regarding medications administration/dosing? Are meds being taken as prescribed? Patient should review meds with caller to confirm Yes he has all meds now.  Se above message about his eliquis which is resolved. 3. Have there been any falls? No 4. Has Home Health been to the house and/or have they contacted you? If not, have you tried to contact them? Can we help you contact them? No HH ordered.  He has outpt rehab at [facility] scheduled. 5. Are bowels and bladder emptying properly? Are there any unexpected incontinence issues? If applicable, is patient following bowel/bladder programs?No, just getting up 3 x at night so he will get a urinal for use. 6. Any fevers, problems with breathing, unexpected pain? No he is only trying to take the oxycodone at night but he was asking about the amount- I explained the restrictions on first Rx and he will call if more is needed. 7. Are there any skin problems or new areas of breakdown? No 8. Has the patient/family member arranged specialty MD follow up (ie cardiology/neurology/renal/surgical/etc)?  Can we help arrange? He has appt in 2 week with Dr Carola Frost and he has spoken with his PCP. I have given him his return appt with Dr Allena Katz. 9. Does the patient need any other services or support that we can help arrange? No 10. Are caregivers following through as expected in assisting the patient? Yes 11. Has the patient quit smoking, drinking alcohol, or using drugs as recommended? Yes and no driving  Appointment Thursday 07/01/20 arrive by 12:40 for  1:00 with Dr Allena Katz.  He has been alerted to watch his mail for a packet from our office with paperwork to fill out and bring. [id number] [employer]

## 2020-06-10 ENCOUNTER — Telehealth: Payer: Self-pay | Admitting: *Deleted

## 2020-06-10 NOTE — Telephone Encounter (Signed)
He would need to come into sign a pain contract.  If Arthur Rios has a sooner availability, he may do so with her. Alternatively I noticed that he is supposed to follow-up with orthopedic surgery but has not scheduled an appointment.  NSAIDs are also an alternative option however I would recommend he follow-up with surgery regarding their preference.  He may follow-up with surgery for both reasons sooner if possible.  Thanks.

## 2020-06-10 NOTE — Telephone Encounter (Signed)
Patient left a message stating he needs a refill on his pain medication. Currently taking oxycodone but seemed willing to try some thing lesser.  He mentioned tramadol. He asked for a call back when that is sent in. Hospital follow up with Dr. Maryla Morrow is scheduled for 07/01/2020

## 2020-06-11 NOTE — Telephone Encounter (Signed)
Patient notified. He states he will try NSAIDS for the pain. He was asking about a return to work and I told him he would need to be seen before return to work note. He mention he is seeing the Orthopedic Surgeon next week and I advised him to ask about return to work then.

## 2020-07-01 ENCOUNTER — Encounter: Payer: 59 | Attending: Physical Medicine & Rehabilitation | Admitting: Physical Medicine & Rehabilitation

## 2020-07-01 ENCOUNTER — Other Ambulatory Visit: Payer: Self-pay

## 2020-07-01 ENCOUNTER — Encounter: Payer: Self-pay | Admitting: Physical Medicine & Rehabilitation

## 2020-07-01 VITALS — BP 162/96 | HR 56 | Temp 98.3°F | Ht 69.0 in | Wt 187.6 lb

## 2020-07-01 DIAGNOSIS — I10 Essential (primary) hypertension: Secondary | ICD-10-CM | POA: Insufficient documentation

## 2020-07-01 DIAGNOSIS — G8918 Other acute postprocedural pain: Secondary | ICD-10-CM | POA: Insufficient documentation

## 2020-07-01 DIAGNOSIS — R609 Edema, unspecified: Secondary | ICD-10-CM | POA: Insufficient documentation

## 2020-07-01 DIAGNOSIS — S32401S Unspecified fracture of right acetabulum, sequela: Secondary | ICD-10-CM | POA: Insufficient documentation
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
## 2020-10-18 ENCOUNTER — Ambulatory Visit (INDEPENDENT_AMBULATORY_CARE_PROVIDER_SITE_OTHER): Payer: 59 | Admitting: Neurology

## 2020-10-18 ENCOUNTER — Encounter: Payer: Self-pay | Admitting: *Deleted

## 2020-10-18 ENCOUNTER — Encounter: Payer: Self-pay | Admitting: Neurology

## 2020-10-18 ENCOUNTER — Other Ambulatory Visit: Payer: Self-pay

## 2020-10-18 VITALS — BP 156/95 | HR 50 | Ht 69.5 in | Wt 187.0 lb

## 2020-10-18 DIAGNOSIS — G43709 Chronic migraine without aura, not intractable, without status migrainosus: Secondary | ICD-10-CM

## 2020-10-18 MED ORDER — UBRELVY 100 MG PO TABS: 100.0000 mg | ORAL_TABLET | ORAL | 0 refills | Status: DC | PRN

## 2020-10-18 MED ORDER — TOPIRAMATE 50 MG PO TABS: ORAL_TABLET | ORAL | 6 refills | Status: DC

## 2020-10-18 MED ORDER — SUMATRIPTAN SUCCINATE 100 MG PO TABS: 100.0000 mg | ORAL_TABLET | Freq: Once | ORAL | 12 refills | Status: DC | PRN

## 2020-10-18 NOTE — Patient Instructions (Signed)
Acute management: Sumatriptan: Please take one tablet at the onset of your headache. If it does not improve the symptoms please take one additional tablet. Do not take more then 2 tablets in 24hrs. Do not take use more then 2 to 3 times in a week. Bernita Raisin: Please take one tablet at the onset of your headache. If it does not improve the symptoms please take one additional tablet. Do not take more then 2 tablets in 24hrs. Do not take use more then 2 to 3 times in a week.  Preventative: Topiramate start with 50mg  at bedtime and increase to 100mg  in 2 weeks  Topiramate tablets What is this medicine? TOPIRAMATE (toe PYRE a mate) is used to treat seizures in adults or children with epilepsy. It is also used for the prevention of migraine headaches. This medicine may be used for other purposes; ask your health care provider or pharmacist if you have questions. COMMON BRAND NAME(S): Topamax, Topiragen What should I tell my health care provider before I take this medicine? They need to know if you have any of these conditions:  bleeding disorder  kidney disease  lung disease  suicidal thoughts, plans, or attempt  an unusual or allergic reaction to topiramate, other medicines, foods, dyes, or preservatives  pregnant or trying to get pregnant  breast-feeding How should I use this medicine? Take this medicine by mouth with a glass of water. Follow the directions on the prescription label. Do not cut, crush or chew this medicine. Swallow the tablets whole. You can take it with or without food. If it upsets your stomach, take it with food. Take your medicine at regular intervals. Do not take it more often than directed. Do not stop taking except on your doctor's advice. A special MedGuide will be given to you by the pharmacist with each prescription and refill. Be sure to read this information carefully each time. Talk to your pediatrician regarding the use of this medicine in children. While this  drug may be prescribed for children as young as 77 years of age for selected conditions, precautions do apply. Overdosage: If you think you have taken too much of this medicine contact a poison control center or emergency room at once. NOTE: This medicine is only for you. Do not share this medicine with others. What if I miss a dose? If you miss a dose, take it as soon as you can. If your next dose is to be taken in less than 6 hours, then do not take the missed dose. Take the next dose at your regular time. Do not take double or extra doses. What may interact with this medicine? This medicine may interact with the following medications:  acetazolamide  alcohol  antihistamines for allergy, cough, and cold  aspirin and aspirin-like medicines  atropine  birth control pills  certain medicines for anxiety or sleep  certain medicines for bladder problems like oxybutynin, tolterodine  certain medicines for depression like amitriptyline, fluoxetine, sertraline  certain medicines for seizures like carbamazepine, phenobarbital, phenytoin, primidone, valproic acid, zonisamide  certain medicines for stomach problems like dicyclomine, hyoscyamine  certain medicines for travel sickness like scopolamine  certain medicines for Parkinson's disease like benztropine, trihexyphenidyl  certain medicines that treat or prevent blood clots like warfarin, enoxaparin, dalteparin, apixaban, dabigatran, and rivaroxaban  digoxin  general anesthetics like halothane, isoflurane, methoxyflurane, propofol  hydrochlorothiazide  ipratropium  lithium  medicines that relax muscles for surgery  metformin  narcotic medicines for pain  NSAIDs, medicines for  pain and inflammation, like ibuprofen or naproxen  phenothiazines like chlorpromazine, mesoridazine, prochlorperazine, thioridazine  pioglitazone This list may not describe all possible interactions. Give your health care provider a list of all  the medicines, herbs, non-prescription drugs, or dietary supplements you use. Also tell them if you smoke, drink alcohol, or use illegal drugs. Some items may interact with your medicine. What should I watch for while using this medicine? Visit your doctor or health care professional for regular checks on your progress. Tell your health care professional if your symptoms do not start to get better or if they get worse. Do not stop taking except on your health care professional's advice. You may develop a severe reaction. Your health care professional will tell you how much medicine to take. Wear a medical ID bracelet or chain. Carry a card that describes your disease and details of your medicine and dosage times. This medicine can reduce the response of your body to heat or cold. Dress warm in cold weather and stay hydrated in hot weather. If possible, avoid extreme temperatures like saunas, hot tubs, very hot or cold showers, or activities that can cause dehydration such as vigorous exercise. Check with your health care professional if you have severe diarrhea, nausea, and vomiting, or if you sweat a lot. The loss of too much body fluid may make it dangerous for you to take this medicine. You may get drowsy or dizzy. Do not drive, use machinery, or do anything that needs mental alertness until you know how this medicine affects you. Do not stand up or sit up quickly, especially if you are an older patient. This reduces the risk of dizzy or fainting spells. Alcohol may interfere with the effect of this medicine. Avoid alcoholic drinks. Tell your health care professional right away if you have any change in your eyesight. Patients and their families should watch out for new or worsening depression or thoughts of suicide. Also watch out for sudden changes in feelings such as feeling anxious, agitated, panicky, irritable, hostile, aggressive, impulsive, severely restless, overly excited and hyperactive, or not  being able to sleep. If this happens, especially at the beginning of treatment or after a change in dose, call your healthcare professional. This medicine may cause serious skin reactions. They can happen weeks to months after starting the medicine. Contact your health care provider right away if you notice fevers or flu-like symptoms with a rash. The rash may be red or purple and then turn into blisters or peeling of the skin. Or, you might notice a red rash with swelling of the face, lips or lymph nodes in your neck or under your arms. Birth control may not work properly while you are taking this medicine. Talk to your health care professional about using an extra method of birth control. Women should inform their health care professional if they wish to become pregnant or think they might be pregnant. There is a potential for serious side effects and harm to an unborn child. Talk to your health care professional for more information. What side effects may I notice from receiving this medicine? Side effects that you should report to your doctor or health care professional as soon as possible:  allergic reactions like skin rash, itching or hives, swelling of the face, lips, or tongue  blood in the urine  changes in vision  confusion  loss of memory  pain in lower back or side  pain when urinating  redness, blistering, peeling or loosening  of the skin, including inside the mouth  signs and symptoms of bleeding such as bloody or black, tarry stools; red or dark brown urine; spitting up blood or brown material that looks like coffee grounds; red spots on the skin; unusual bruising or bleeding from the eyes, gums, or nose  signs and symptoms of increased acid in the body like breathing fast; fast heartbeat; headache; confusion; unusually weak or tired; nausea, vomiting  suicidal thoughts, mood changes  trouble speaking or understanding  unusual sweating  unusually weak or tired Side  effects that usually do not require medical attention (report to your doctor or health care professional if they continue or are bothersome):  dizziness  drowsiness  fever  loss of appetite  nausea, vomiting  pain, tingling, numbness in the hands or feet  stomach pain  tiredness  upset stomach This list may not describe all possible side effects. Call your doctor for medical advice about side effects. You may report side effects to FDA at 1-800-FDA-1088. Where should I keep my medicine? Keep out of the reach of children and pets. Store between 15 and 30 degrees C (59 and 86 degrees F). Protect from moisture. Keep the container tightly closed. Get rid of any unused medicine after the expiration date. To get rid of medicines that are no longer needed or have expired:  Take the medicine to a medicine take-back program. Check with your pharmacy or law enforcement to find a location.  If you cannot return the medicine, check the label or package insert to see if the medicine should be thrown out in the garbage or flushed down the toilet. If you are not sure, ask your health care provider. If it is safe to put it in the trash, empty the medicine out of the container. Mix the medicine with cat litter, dirt, coffee grounds, or other unwanted substance. Seal the mixture in a bag or container. Put it in the trash. NOTE: This sheet is a summary. It may not cover all possible information. If you have questions about this medicine, talk to your doctor, pharmacist, or health care provider.  2021 [employer] Standard (2020-03-04 15:41:57) Ubrogepant tablets What is this medicine? UBROGEPANT (ue BROE je pant) is used to treat migraine headaches with or without aura. An aura is a strange feeling or visual disturbance that warns you of an attack. It is not used to prevent migraines. This medicine may be used for other purposes; ask your health care provider or pharmacist if you have  questions. COMMON BRAND NAME(S): Bernita Raisin What should I tell my health care provider before I take this medicine? They need to know if you have any of these conditions:  kidney disease  liver disease  an unusual or allergic reaction to ubrogepant, other medicines, foods, dyes, or preservatives  pregnant or trying to get pregnant  breast-feeding How should I use this medicine? Take this medicine by mouth with a glass of water. Follow the directions on the prescription label. You can take it with or without food. If it upsets your stomach, take it with food. Take your medicine at regular intervals. Do not take it more often than directed. Do not stop taking except on your doctor's advice. Talk to your pediatrician about the use of this medicine in children. Special care may be needed. Overdosage: If you think you have taken too much of this medicine contact a poison control center or emergency room at once. NOTE: This medicine is only for you. Do not  share this medicine with others. What if I miss a dose? This does not apply. This medicine is not for regular use. What may interact with this medicine? Do not take this medicine with any of the following medicines:  ceritinib  certain antibiotics like chloramphenicol, clarithromycin, telithromycin  certain antivirals for HIV like atazanavir, cobicistat, darunavir, delavirdine, fosamprenavir, indinavir, ritonavir  certain medicines for fungal infections like itraconazole, ketoconazole, posaconazole, voriconazole  conivaptan  grapefruit  idelalisib  mifepristone  nefazodone  ribociclib This medicine may also interact with the following medications:  carvedilol  certain medicines for seizures like phenobarbital, phenytoin  ciprofloxacin  cyclosporine  eltrombopag  fluconazole  fluvoxamine  quinidine  rifampin  St. John's wort  verapamil This list may not describe all possible interactions. Give your health  care provider a list of all the medicines, herbs, non-prescription drugs, or dietary supplements you use. Also tell them if you smoke, drink alcohol, or use illegal drugs. Some items may interact with your medicine. What should I watch for while using this medicine? Visit your health care professional for regular checks on your progress. Tell your health care professional if your symptoms do not start to get better or if they get worse. Your mouth may get dry. Chewing sugarless gum or sucking hard candy and drinking plenty of water may help. Contact your health care professional if the problem does not go away or is severe. What side effects may I notice from receiving this medicine? Side effects that you should report to your doctor or health care professional as soon as possible:  allergic reactions like skin rash, itching or hives; swelling of the face, lips, or tongue Side effects that usually do not require medical attention (report these to your doctor or health care professional if they continue or are bothersome):  drowsiness  dry mouth  nausea  tiredness This list may not describe all possible side effects. Call your doctor for medical advice about side effects. You may report side effects to FDA at 1-800-FDA-1088. Where should I keep my medicine? Keep out of the reach of children. Store at room temperature between 15 and 30 degrees C (59 and 86 degrees F). Throw away any unused medicine after the expiration date. NOTE: This sheet is a summary. It may not cover all possible information. If you have questions about this medicine, talk to your doctor, pharmacist, or health care provider.  2021 [employer] Standard (2018-11-07 08:50:55) Sumatriptan tablets What is this medicine? SUMATRIPTAN (soo ma TRIP tan) is used to treat migraines with or without aura. An aura is a strange feeling or visual disturbance that warns you of an attack. It is not used to prevent migraines. This  medicine may be used for other purposes; ask your health care provider or pharmacist if you have questions. COMMON BRAND NAME(S): Imitrex, Migraine Pack What should I tell my health care provider before I take this medicine? They need to know if you have any of these conditions:  cigarette smoker  circulation problems in fingers and toes  diabetes  heart disease  high blood pressure  high cholesterol  history of irregular heartbeat  history of stroke  kidney disease  liver disease  stomach or intestine problems  an unusual or allergic reaction to sumatriptan, other medicines, foods, dyes, or preservatives  pregnant or trying to get pregnant  breast-feeding How should I use this medicine? Take this medicine by mouth with a glass of water. Follow the directions on the prescription label. Do not  take it more often than directed. Talk to your pediatrician regarding the use of this medicine in children. Special care may be needed. Overdosage: If you think you have taken too much of this medicine contact a poison control center or emergency room at once. NOTE: This medicine is only for you. Do not share this medicine with others. What if I miss a dose? This does not apply. This medicine is not for regular use. What may interact with this medicine? Do not take this medicine with any of the following medicines:  certain medicines for migraine headache like almotriptan, eletriptan, frovatriptan, naratriptan, rizatriptan, sumatriptan, zolmitriptan  ergot alkaloids like dihydroergotamine, ergonovine, ergotamine, methylergonovine  MAOIs like Carbex, Eldepryl, Marplan, Nardil, and Parnate This medicine may also interact with the following medications:  certain medicines for depression, anxiety, or psychotic disorders This list may not describe all possible interactions. Give your health care provider a list of all the medicines, herbs, non-prescription drugs, or dietary  supplements you use. Also tell them if you smoke, drink alcohol, or use illegal drugs. Some items may interact with your medicine. What should I watch for while using this medicine? Visit your healthcare professional for regular checks on your progress. Tell your healthcare professional if your symptoms do not start to get better or if they get worse. You may get drowsy or dizzy. Do not drive, use machinery, or do anything that needs mental alertness until you know how this medicine affects you. Do not stand up or sit up quickly, especially if you are an older patient. This reduces the risk of dizzy or fainting spells. Alcohol may interfere with the effect of this medicine. Tell your healthcare professional right away if you have any change in your eyesight. If you take migraine medicines for 10 or more days a month, your migraines may get worse. Keep a diary of headache days and medicine use. Contact your healthcare professional if your migraine attacks occur more frequently. What side effects may I notice from receiving this medicine? Side effects that you should report to your doctor or health care professional as soon as possible:  allergic reactions like skin rash, itching or hives, swelling of the face, lips, or tongue  changes in vision  chest pain or chest tightness  signs and symptoms of a dangerous change in heartbeat or heart rhythm like chest pain; dizziness; fast, irregular heartbeat; palpitations; feeling faint or lightheaded; falls; breathing problems  signs and symptoms of a stroke like changes in vision; confusion; trouble speaking or understanding; severe headaches; sudden numbness or weakness of the face, arm or leg; trouble walking; dizziness; loss of balance or coordination  signs and symptoms of serotonin syndrome like irritable; confusion; diarrhea; fast or irregular heartbeat; muscle twitching; stiff muscles; trouble walking; sweating; high fever; seizures; chills;  vomiting Side effects that usually do not require medical attention (report to your doctor or health care professional if they continue or are bothersome):  diarrhea  dizziness  drowsiness  dry mouth  headache  nausea, vomiting  pain, tingling, numbness in the hands or feet  stomach pain This list may not describe all possible side effects. Call your doctor for medical advice about side effects. You may report side effects to FDA at 1-800-FDA-1088. Where should I keep my medicine? Keep out of the reach of children. Store at room temperature between 2 and 30 degrees C (36 and 86 degrees F). Throw away any unused medicine after the expiration date. NOTE: This sheet is a summary.  It may not cover all possible information. If you have questions about this medicine, talk to your doctor, pharmacist, or health care provider.  2021 [employer] Standard (2018-03-05 15:05:37)

## 2020-10-21 ENCOUNTER — Emergency Department (HOSPITAL_BASED_OUTPATIENT_CLINIC_OR_DEPARTMENT_OTHER)
Admission: EM | Admit: 2020-10-21 | Discharge: 2020-10-21 | Disposition: A | Discharge status: Home / Self Care | Payer: 59 | Attending: Emergency Medicine | Admitting: Emergency Medicine

## 2020-10-21 ENCOUNTER — Emergency Department (HOSPITAL_BASED_OUTPATIENT_CLINIC_OR_DEPARTMENT_OTHER): Payer: 59

## 2020-10-21 ENCOUNTER — Other Ambulatory Visit: Payer: Self-pay

## 2020-10-21 ENCOUNTER — Encounter (HOSPITAL_BASED_OUTPATIENT_CLINIC_OR_DEPARTMENT_OTHER): Payer: Self-pay | Admitting: *Deleted

## 2020-10-21 DIAGNOSIS — I10 Essential (primary) hypertension: Secondary | ICD-10-CM | POA: Insufficient documentation

## 2020-10-21 DIAGNOSIS — S42291A Other displaced fracture of upper end of right humerus, initial encounter for closed fracture: Secondary | ICD-10-CM | POA: Diagnosis not present

## 2020-10-21 DIAGNOSIS — W010XXA Fall on same level from slipping, tripping and stumbling without subsequent striking against object, initial encounter: Secondary | ICD-10-CM | POA: Insufficient documentation

## 2020-10-21 DIAGNOSIS — S4991XA Unspecified injury of right shoulder and upper arm, initial encounter: Secondary | ICD-10-CM | POA: Diagnosis present

## 2020-10-21 DIAGNOSIS — Y9301 Activity, walking, marching and hiking: Secondary | ICD-10-CM | POA: Insufficient documentation

## 2020-10-21 MED ORDER — OXYCODONE HCL 5 MG PO TABS: 5.0000 mg | ORAL_TABLET | Freq: Four times a day (QID) | ORAL | 0 refills | Status: DC | PRN

## 2020-10-21 MED ORDER — OXYCODONE HCL 5 MG PO TABS
5.0000 mg | ORAL_TABLET | Freq: Once | ORAL | Status: AC
  Administered 2020-10-21: 5 mg via ORAL
  Filled 2020-10-21: qty 1

## 2020-10-21 NOTE — ED Notes (Signed)
Pt  X ray

## 2020-10-21 NOTE — Discharge Instructions (Signed)
Wear sling at all times for comfort, no weightbearing to right upper extremity, you may remove the sling to shower.  Continue Tylenol and Motrin for pain as well.  Use her oxycodone for breakthrough pain.  Recommend ice as well.  Follow-up with your orthopedic surgeon.

## 2020-10-21 NOTE — ED Provider Notes (Signed)
[facility] EMERGENCY DEPARTMENT Provider Note   CSN: [id number] Arrival date & time: 10/21/20  2003     History Chief Complaint  Patient presents with  . Fall  . Shoulder Injury    Arthur Rios is a 65 y.o. male.  The history is provided by the patient.  Shoulder Pain Location:  Shoulder Shoulder location:  R shoulder Injury: yes   Mechanism of injury: fall   Pain details:    Quality:  Aching   Radiates to:  Does not radiate   Severity:  Mild   Onset quality:  Sudden   Timing:  Constant   Progression:  Unchanged Handedness:  Right-handed Relieved by:  Nothing Worsened by:  Movement Associated symptoms: decreased range of motion   Associated symptoms: no back pain, no fatigue, no fever, no muscle weakness, no neck pain, no numbness, no stiffness and no swelling        Past Medical History:  Diagnosis Date  . Acetabulum fracture, right (HCC) 05/14/2020  . GERD (gastroesophageal reflux disease)   . Hypertension   . Migraine   . Mild sleep apnea   . Narcolepsy   . Psoriasis   . Psoriatic arthritis (HCC)   . Right clavicle fracture 05/14/2020  . Vitamin D insufficiency 05/14/2020    Patient Active Problem List   Diagnosis Date Noted  . Chronic migraine without aura without status migrainosus, not intractable 10/18/2020  . Benign essential HTN 07/01/2020  . Peripheral edema 07/01/2020  . Acute blood loss anemia   . Post-operative pain   . Urinary retention   . Drug induced constipation   . Hyponatremia   . Right acetabular fracture (HCC) 05/22/2020  . Vitamin D insufficiency 05/14/2020  . Acetabulum fracture, right (HCC) 05/14/2020  . Right clavicle fracture 05/14/2020  . Rib fractures 05/10/2020    Past Surgical History:  Procedure Laterality Date  . COLONOSCOPY  07/2003, 08/2015  . LUMBAR SPINE SURGERY  09/2013   decompression  . ORIF ACETABULAR FRACTURE Right 05/13/2020   Procedure: OPEN REDUCTION INTERNAL FIXATION (ORIF) ACETABULAR  FRACTURE;  Surgeon: Myrene Galas, MD;  Location: [facility] OR;  Service: Orthopedics;  Laterality: Right;  . ORIF CLAVICULAR FRACTURE Right 05/13/2020   Procedure: OPEN REDUCTION INTERNAL FIXATION (ORIF) CLAVICULAR FRACTURE;  Surgeon: Myrene Galas, MD;  Location: [facility] OR;  Service: Orthopedics;  Laterality: Right;       Family History  Problem Relation Age of Onset  . Chronic fatigue Mother   . Heart attack Mother   . Lung cancer Father   . Heart attack Father        s/p CABG  . COPD Father   . Lung cancer Maternal Grandmother   . Emphysema Maternal Grandfather   . Heart attack Maternal Grandfather   . COPD Maternal Grandfather   . Stroke Paternal Grandmother   . Heart attack Paternal Grandfather   . Colon cancer Paternal Aunt   . Headache Neg Hx     Social History   Tobacco Use  . Smoking status: Never Smoker  . Smokeless tobacco: Never Used  Vaping Use  . Vaping Use: Never used  Substance Use Topics  . Alcohol use: Yes    Comment: </= 1 drink/month  . Drug use: Not Currently    Home Medications Prior to Admission medications   Medication Sig Start Date End Date Taking? Authorizing Provider  oxyCODONE (ROXICODONE) 5 MG immediate release tablet Take 1 tablet (5 mg total) by mouth every 6 (six) hours  as needed for up to 20 doses for severe pain or breakthrough pain. 10/21/20  Yes Lyvonne Cassell, DO  acetaminophen (TYLENOL) 500 MG tablet Take 500 mg by mouth as needed.    [provider]  Calcipotriene-Betameth Diprop (ENSTILAR) 0.005-0.064 % FOAM Apply topically. Patient not taking: Reported on 10/18/2020    [provider]  fluticasone (FLONASE) 50 MCG/ACT nasal spray Place 1 spray into both nostrils daily as needed for allergies.     [provider]  indomethacin (INDOCIN) 50 MG capsule Take by mouth at bedtime. 06/17/20   [provider]  modafinil (PROVIGIL) 200 MG tablet Take 200 mg by mouth daily.  Patient not taking: Reported on  10/18/2020 05/03/20   [provider]  Multiple Vitamins-Minerals (MULTIVITAMIN ADULTS PO) Take 1 tablet by mouth daily.    [provider]  omeprazole (PRILOSEC) 20 MG capsule Take 1 capsule (20 mg total) by mouth daily. 05/31/20   Angiulli, Mcarthur Rossettianiel J, PA-C  rizatriptan (MAXALT) 10 MG tablet Take 10 mg by mouth daily as needed for migraine.    [provider]  SUMAtriptan (IMITREX) 100 MG tablet Take 1 tablet (100 mg total) by mouth once as needed for up to 1 dose. May repeat in 2 hours if headache persists or recurs. 10/18/20   Anson FretAhern, Antonia B, MD  topiramate (TOPAMAX) 50 MG tablet start with 50mg (1 tab) at bedtime and increase to 100mg (2 tabs) in 2 weeks 10/18/20   Anson FretAhern, Antonia B, MD  Ubrogepant (UBRELVY) 100 MG TABS Take 100 mg by mouth every 2 (two) hours as needed. Maximum 200mg  a day. 10/18/20   Anson FretAhern, Antonia B, MD    Allergies    Patient has no known allergies.  Review of Systems   Review of Systems  Constitutional: Negative for chills, fatigue and fever.  HENT: Negative for ear pain and sore throat.   Eyes: Negative for pain and visual disturbance.  Respiratory: Negative for cough and shortness of breath.   Cardiovascular: Negative for chest pain and palpitations.  Gastrointestinal: Negative for abdominal pain and vomiting.  Genitourinary: Negative for dysuria and hematuria.  Musculoskeletal: Positive for arthralgias. Negative for back pain, neck pain and stiffness.  Skin: Negative for color change, pallor, rash and wound.  Neurological: Negative for seizures and syncope.  All other systems reviewed and are negative.   Physical Exam Updated Vital Signs BP 124/83 (BP Location: Left Arm)   Pulse (!) 59   Temp (!) 97.5 F (36.4 C) (Oral)   Resp 20   Ht 5' 9.5" (1.765 m)   Wt 84.8 kg   SpO2 99%   BMI 27.21 kg/m   Physical Exam Vitals and nursing note reviewed.  Constitutional:      Appearance: He is well-developed and well-nourished.  HENT:      Head: Normocephalic and atraumatic.     Mouth/Throat:     Mouth: Mucous membranes are moist.  Eyes:     Extraocular Movements: Extraocular movements intact.     Conjunctiva/sclera: Conjunctivae normal.     Pupils: Pupils are equal, round, and reactive to light.  Cardiovascular:     Rate and Rhythm: Normal rate and regular rhythm.     Pulses: Normal pulses.     Heart sounds: Normal heart sounds. No murmur heard.   Pulmonary:     Effort: Pulmonary effort is normal. No respiratory distress.     Breath sounds: Normal breath sounds.  Abdominal:     Palpations: Abdomen is soft.  Tenderness: There is no abdominal tenderness.  Musculoskeletal:        General: Tenderness present. No edema.     Cervical back: Normal range of motion and neck supple. No tenderness.     Comments: Tenderness to the right shoulder especially with minimal range of motion, shoulder appears located no tenderness over the clavicle  Skin:    General: Skin is warm and dry.  Neurological:     General: No focal deficit present.     Mental Status: He is alert.     Sensory: No sensory deficit.     Motor: No weakness.  Psychiatric:        Mood and Affect: Mood and affect normal.     ED Results / Procedures / Treatments   Labs (all labs ordered are listed, but only abnormal results are displayed) Labs Reviewed - No data to display  EKG None  Radiology DG Clavicle Right  Result Date: 10/21/2020 CLINICAL DATA:  Right clavicular pain, history of fracture EXAM: RIGHT CLAVICLE - 2+ VIEWS COMPARISON:  05/13/2020 FINDINGS: Frontal and lordotic views of the right clavicle are obtained. The plate and screw fixation traversing the prior right clavicular fracture is not significantly changed. Fracture line is no longer apparent. There is an acute impacted right humeral neck fracture, new since prior study. Please see dedicated right shoulder series. Right chest is clear. IMPRESSION: 1. Acute impacted right humeral neck  fracture. Please refer to dedicated right shoulder series. 2. Stable ORIF of a right clavicular fracture, with no acute clavicular abnormality. Electronically Signed   By: Sharlet Salina M.D.   On: 10/21/2020 20:39   DG Shoulder Right  Result Date: 10/21/2020 CLINICAL DATA:  Pain following fall EXAM: RIGHT SHOULDER - 2+ VIEW COMPARISON:  None. FINDINGS: Frontal, oblique, and Y scapular images were obtained. There is a fracture along the medial aspect of the humeral head with impaction at the fracture site medially. No other fracture. No dislocation. There is postoperative change in the clavicle with screw and plate fixation. No appreciable joint space narrowing or erosion. Several old healed right rib fractures noted. IMPRESSION: Fracture along the medial aspect of the humeral head with impaction at the fracture site. No dislocation. Postoperative change right clavicle. No appreciable joint space narrowing. Old healed rib fractures noted on the right. Electronically Signed   By: Bretta Bang III M.D.   On: 10/21/2020 20:36    Procedures Procedures   Medications Ordered in ED Medications  oxyCODONE (Oxy IR/ROXICODONE) immediate release tablet 5 mg (5 mg Oral Given 10/21/20 2038)    ED Course  I have reviewed the triage vital signs and the nursing notes.  Pertinent labs & imaging results that were available during my care of the patient were reviewed by me and considered in my medical decision making (see chart for details).    MDM Rules/Calculators/A&P                          Arthur Rios is a 65 year old male who presents the ED with right shoulder pain after fall.  Patient with unremarkable vitals.  No fever.  Patient tripped while walking his dog and landed on his right shoulder.  Did not hit his head or lose consciousness.  Pain mostly in the right shoulder.  It appears to be located.  X-ray showed humeral neck fracture.  Neurovascularly and neuromuscularly intact on exam.   Previous clavicle fracture is intact.  Patient follows  with Dr. Carola Frost with orthopedics.  We will have him follow-up with them.  Placed in a sling.  Understands fracture precautions.  Will prescribe Roxicodone.  Discharged in good condition.  This chart was dictated using voice recognition software.  Despite best efforts to proofread,  errors can occur which can change the documentation meaning.   Final Clinical Impression(s) / ED Diagnoses Final diagnoses:  Humeral head fracture, right, closed, initial encounter    Rx / DC Orders ED Discharge Orders         Ordered    oxyCODONE (ROXICODONE) 5 MG immediate release tablet  Every 6 hours PRN        10/21/20 2056           Virgina Norfolk, DO 10/21/20 2100

## 2020-10-21 NOTE — ED Triage Notes (Signed)
Fall. He was walking his dog tonight and he tripped over the dog. Right shoulder injury. Hx of extension right shoulder injuries.

## 2021-01-20 ENCOUNTER — Ambulatory Visit: Payer: 59 | Admitting: Neurology

## 2021-02-06 IMAGING — CR DG CLAVICLE*R*
2 series · 2 of 2 positions shown · non-contrast
Comparison: 05/10/2020

CLINICAL DATA: Known right clavicular fracture

EXAM:
RIGHT CLAVICLE - 2+ VIEWS

[clavicle ap]
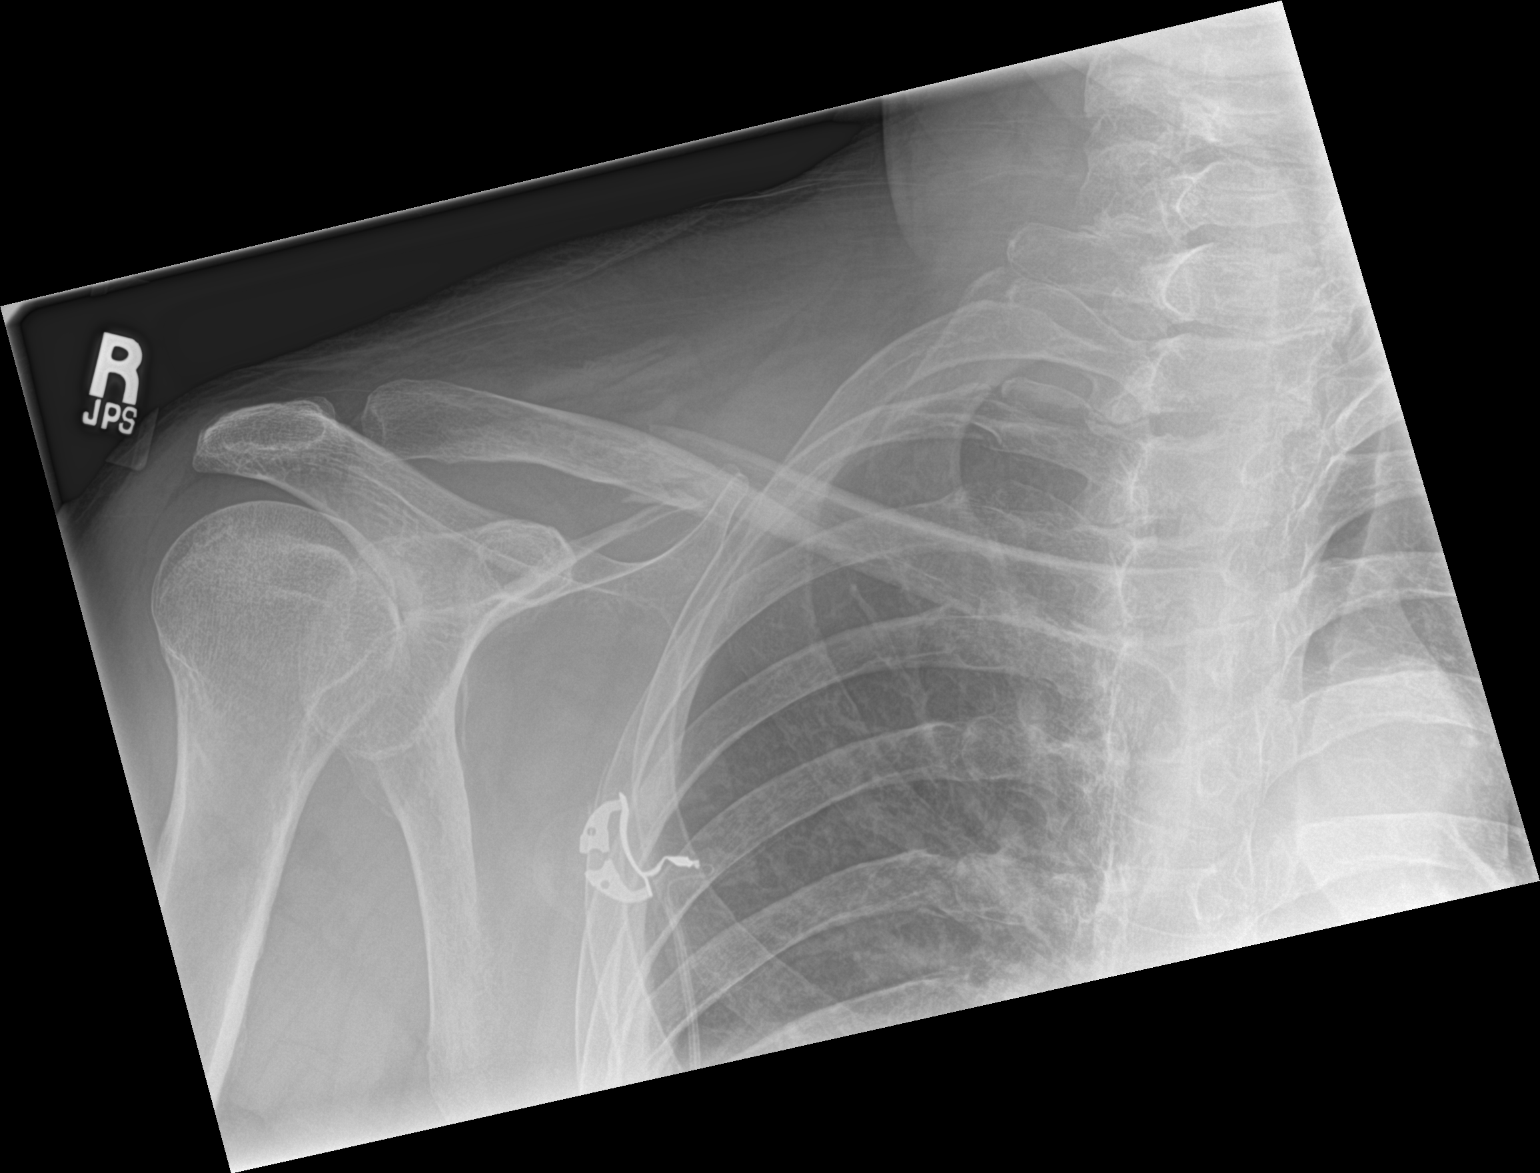

[clavicle axial]
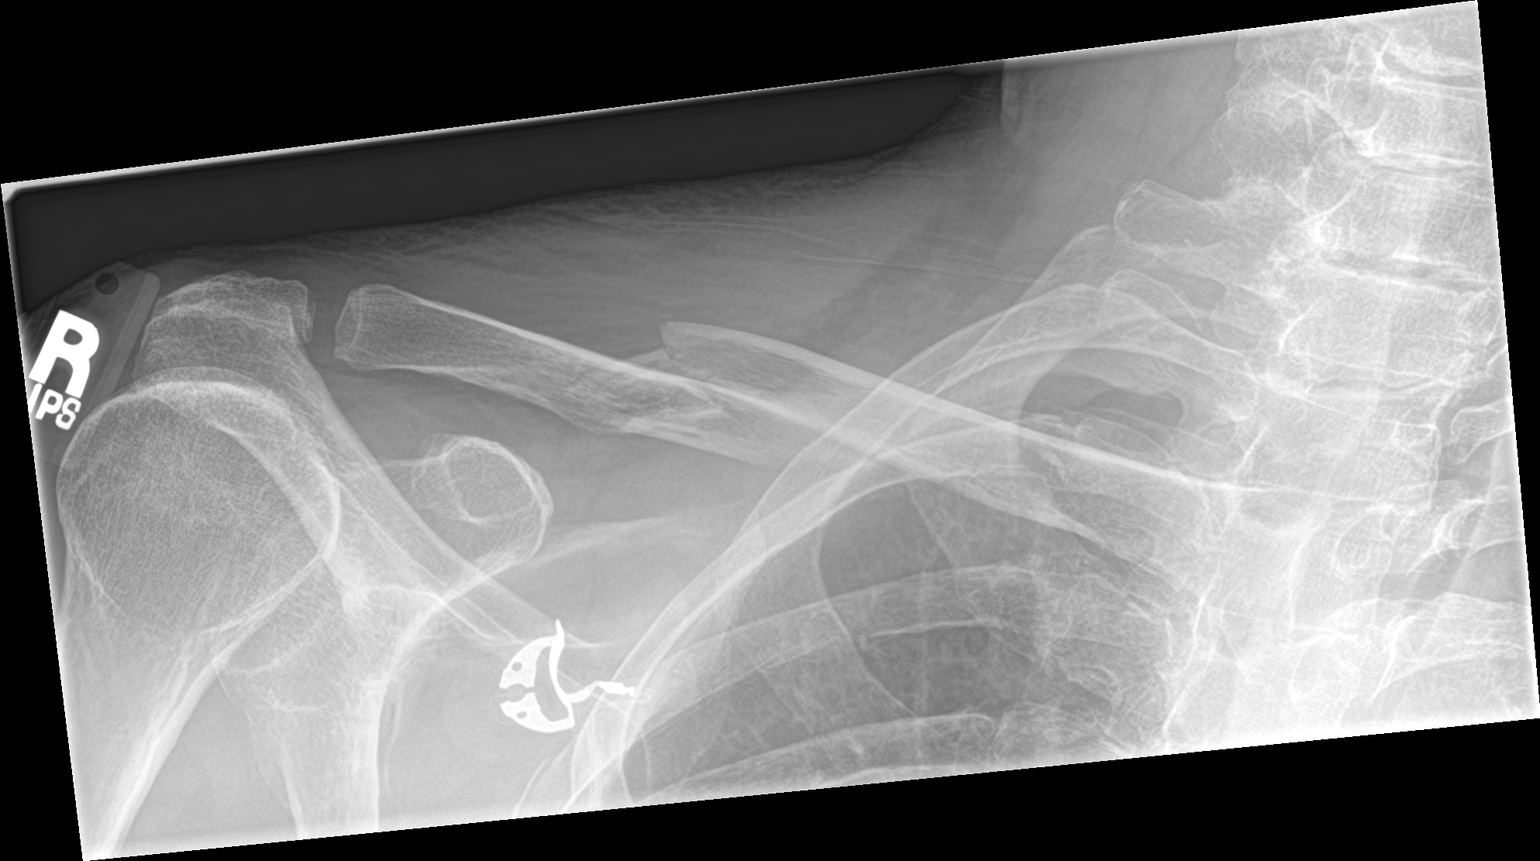

[2 of 2 positions shown; findings below may reference images not displayed]

FINDINGS: Midshaft right clavicular fracture is again seen. The degree of
displacement of the fracture fragments is stable with downward
depression of the distal fracture fragment with regards to the
central portion of the clavicle. No other focal abnormality is
noted.
IMPRESSION: Midshaft right clavicular fracture as described.

## 2021-03-10 ENCOUNTER — Ambulatory Visit (INDEPENDENT_AMBULATORY_CARE_PROVIDER_SITE_OTHER): Payer: 59 | Admitting: Neurology

## 2021-03-10 ENCOUNTER — Encounter: Payer: Self-pay | Admitting: Neurology

## 2021-03-10 VITALS — BP 130/91 | HR 62 | Ht 70.0 in | Wt 178.0 lb

## 2021-03-10 DIAGNOSIS — G43709 Chronic migraine without aura, not intractable, without status migrainosus: Secondary | ICD-10-CM | POA: Diagnosis not present

## 2021-03-10 MED ORDER — NURTEC 75 MG PO TBDP: 75.0000 mg | ORAL_TABLET | Freq: Every day | ORAL | 6 refills | Status: DC | PRN

## 2021-03-10 MED ORDER — TOPIRAMATE 50 MG PO TABS
150.0000 mg | ORAL_TABLET | Freq: Every evening | ORAL | 6 refills | Status: DC
Start: 2021-03-10 — End: 2021-06-26
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
## 2021-03-10 NOTE — Patient Instructions (Signed)
Increase Topiramate to 150mg  at bedtime Nurtec acutely  Rimegepant oral dissolving tablet What is this medication? RIMEGEPANT (ri ME je pant) is used to treat migraine headaches with or without aura. An aura is a strange feeling or visual disturbance that warns you of anattack. It is also used to prevent migraine headaches. This medicine may be used for other purposes; ask your health care provider orpharmacist if you have questions. COMMON BRAND NAME(S): NURTEC ODT What should I tell my care team before I take this medication? They need to know if you have any of these conditions: kidney disease liver disease an unusual or allergic reaction to rimegepant, other medicines, foods, dyes, or preservatives pregnant or trying to get pregnant breast-feeding How should I use this medication? Take the medicine by mouth. Follow the directions on the prescription label. Leave the tablet in the sealed blister pack until you are ready to take it. With dry hands, open the blister and gently remove the tablet. If the tablet breaks or crumbles, throw it away and take a new tablet out of the blister pack. Place the tablet in the mouth and allow it to dissolve, and then swallow. Do not cut, crush, or chew this medicine. You do not need water to take thismedicine. Talk to your pediatrician about the use of this medicine in children. Specialcare may be needed. Overdosage: If you think you have taken too much of this medicine contact apoison control center or emergency room at once. NOTE: This medicine is only for you. Do not share this medicine with others. What if I miss a dose? This does not apply. This medicine is not for regular use. What may interact with this medication? This medicine may interact with the following medications: certain medicines for fungal infections like fluconazole, itraconazole rifampin This list may not describe all possible interactions. Give your health care provider a list of  all the medicines, herbs, non-prescription drugs, or dietary supplements you use. Also tell them if you smoke, drink alcohol, or use illegaldrugs. Some items may interact with your medicine. What should I watch for while using this medication? Visit your health care professional for regular checks on your progress. Tell your health care professional if your symptoms do not start to get better or ifthey get worse. What side effects may I notice from receiving this medication? Side effects that you should report to your doctor or health care professionalas soon as possible: allergic reactions like skin rash, itching or hives; swelling of the face, lips, or tongue Side effects that usually do not require medical attention (report these toyour doctor or health care professional if they continue or are bothersome): nausea This list may not describe all possible side effects. Call your doctor for medical advice about side effects. You may report side effects to FDA at1-800-FDA-1088. Where should I keep my medication? Keep out of the reach of children and pets. Store at room temperature between 20 and 25 degrees C (68 and 77 degrees F).Get rid of any unused medicine after the expiration date. To get rid of medicines that are no longer needed or have expired: Take the medicine to a medicine take-back program. Check with your pharmacy or law enforcement to find a location. If you cannot return the medicine, check the label or package insert to see if the medicine should be thrown out in the garbage or flushed down the toilet. If you are not sure, ask your health care provider. If it is safe to put it  in the trash, take the medicine out of the container. Mix the medicine with cat litter, dirt, coffee grounds, or other unwanted substance. Seal the mixture in a bag or container. Put it in the trash. NOTE: This sheet is a summary. It may not cover all possible information. If you have questions about this  medicine, talk to your doctor, pharmacist, orhealth care provider.  2022 Elsevier/Gold Standard (2020-02-03 17:56:55)

## 2021-06-23 ENCOUNTER — Telehealth (INDEPENDENT_AMBULATORY_CARE_PROVIDER_SITE_OTHER): Payer: 59 | Admitting: Neurology

## 2021-06-23 DIAGNOSIS — G43009 Migraine without aura, not intractable, without status migrainosus: Secondary | ICD-10-CM | POA: Diagnosis not present
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
## 2021-06-26 DIAGNOSIS — G43009 Migraine without aura, not intractable, without status migrainosus: Secondary | ICD-10-CM | POA: Insufficient documentation

## 2021-06-26 MED ORDER — SUMATRIPTAN SUCCINATE 100 MG PO TABS: 100.0000 mg | ORAL_TABLET | ORAL | 12 refills | Status: AC | PRN

## 2021-06-26 NOTE — Patient Instructions (Signed)
Decrease Topiramate by one tablet each week then stop Continue Sumatriptan prn Follow up as needed

## 2021-06-27 ENCOUNTER — Telehealth: Payer: Self-pay | Admitting: Neurology

## 2021-06-27 NOTE — Telephone Encounter (Signed)
Referral sent to [facility]. Phone: [phone].
